# Patient Record
Sex: Female | Born: 1980 | Race: Black or African American | Hispanic: No | Marital: Single | Smoking: Former smoker
Health system: Southern US, Community
[De-identification: ages and names within clinical notes are randomized; demographics above are authoritative.]

## PROBLEM LIST (undated history)

## (undated) DIAGNOSIS — D649 Anemia, unspecified: Secondary | ICD-10-CM

## (undated) DIAGNOSIS — R87619 Unspecified abnormal cytological findings in specimens from cervix uteri: Secondary | ICD-10-CM

## (undated) DIAGNOSIS — B069 Rubella without complication: Secondary | ICD-10-CM

## (undated) DIAGNOSIS — I499 Cardiac arrhythmia, unspecified: Secondary | ICD-10-CM

## (undated) DIAGNOSIS — Z30017 Encounter for initial prescription of implantable subdermal contraceptive: Secondary | ICD-10-CM

## (undated) DIAGNOSIS — O24419 Gestational diabetes mellitus in pregnancy, unspecified control: Secondary | ICD-10-CM

## (undated) DIAGNOSIS — IMO0002 Reserved for concepts with insufficient information to code with codable children: Secondary | ICD-10-CM

## (undated) DIAGNOSIS — I10 Essential (primary) hypertension: Secondary | ICD-10-CM

## (undated) DIAGNOSIS — B059 Measles without complication: Secondary | ICD-10-CM

## (undated) DIAGNOSIS — F329 Major depressive disorder, single episode, unspecified: Secondary | ICD-10-CM

## (undated) DIAGNOSIS — Z5189 Encounter for other specified aftercare: Secondary | ICD-10-CM

## (undated) DIAGNOSIS — N938 Other specified abnormal uterine and vaginal bleeding: Secondary | ICD-10-CM

## (undated) DIAGNOSIS — S82892A Other fracture of left lower leg, initial encounter for closed fracture: Secondary | ICD-10-CM

## (undated) HISTORY — DX: Other fracture of left lower leg, initial encounter for closed fracture: S82.892A

## (undated) HISTORY — DX: Cardiac arrhythmia, unspecified: I49.9

## (undated) HISTORY — DX: Reserved for concepts with insufficient information to code with codable children: IMO0002

## (undated) HISTORY — DX: Anemia, unspecified: D64.9

## (undated) HISTORY — PX: CHOLECYSTECTOMY: SHX55

## (undated) HISTORY — PX: OTHER SURGICAL HISTORY: SHX169

## (undated) HISTORY — DX: Encounter for other specified aftercare: Z51.89

## (undated) HISTORY — DX: Major depressive disorder, single episode, unspecified: F32.9

## (undated) HISTORY — DX: Gestational diabetes mellitus in pregnancy, unspecified control: O24.419

## (undated) HISTORY — DX: Encounter for initial prescription of implantable subdermal contraceptive: Z30.017

## (undated) HISTORY — DX: Other specified abnormal uterine and vaginal bleeding: N93.8

## (undated) HISTORY — DX: Unspecified abnormal cytological findings in specimens from cervix uteri: R87.619

---

## 2005-05-08 ENCOUNTER — Ambulatory Visit (HOSPITAL_COMMUNITY): Admission: RE | Admit: 2005-05-08 | Discharge: 2005-05-08 | Payer: Self-pay | Admitting: Family Medicine

## 2005-05-08 ENCOUNTER — Ambulatory Visit: Payer: Self-pay | Admitting: Family Medicine

## 2005-05-10 ENCOUNTER — Other Ambulatory Visit: Admission: RE | Admit: 2005-05-10 | Discharge: 2005-05-10 | Payer: Self-pay | Admitting: Obstetrics & Gynecology

## 2005-05-15 ENCOUNTER — Ambulatory Visit: Payer: Self-pay | Admitting: Family Medicine

## 2005-10-18 ENCOUNTER — Ambulatory Visit: Payer: Self-pay | Admitting: Family Medicine

## 2005-12-30 ENCOUNTER — Encounter (INDEPENDENT_AMBULATORY_CARE_PROVIDER_SITE_OTHER): Payer: Self-pay | Admitting: Family Medicine

## 2006-01-19 ENCOUNTER — Ambulatory Visit: Payer: Self-pay | Admitting: Family Medicine

## 2006-01-22 ENCOUNTER — Encounter (INDEPENDENT_AMBULATORY_CARE_PROVIDER_SITE_OTHER): Payer: Self-pay | Admitting: Family Medicine

## 2006-02-02 ENCOUNTER — Other Ambulatory Visit: Admission: RE | Admit: 2006-02-02 | Discharge: 2006-02-02 | Payer: Self-pay | Admitting: Family Medicine

## 2006-02-02 ENCOUNTER — Ambulatory Visit: Payer: Self-pay | Admitting: Family Medicine

## 2006-02-02 ENCOUNTER — Ambulatory Visit (HOSPITAL_COMMUNITY): Admission: RE | Admit: 2006-02-02 | Discharge: 2006-02-02 | Payer: Self-pay | Admitting: Family Medicine

## 2006-02-06 ENCOUNTER — Ambulatory Visit (HOSPITAL_COMMUNITY): Admission: RE | Admit: 2006-02-06 | Discharge: 2006-02-06 | Payer: Self-pay | Admitting: Family Medicine

## 2006-03-02 ENCOUNTER — Ambulatory Visit: Payer: Self-pay | Admitting: Family Medicine

## 2006-04-18 ENCOUNTER — Ambulatory Visit (HOSPITAL_COMMUNITY): Admission: RE | Admit: 2006-04-18 | Discharge: 2006-04-18 | Payer: Self-pay | Admitting: Family Medicine

## 2006-04-18 ENCOUNTER — Ambulatory Visit: Payer: Self-pay | Admitting: Family Medicine

## 2006-04-23 ENCOUNTER — Ambulatory Visit: Payer: Self-pay | Admitting: Family Medicine

## 2006-05-01 ENCOUNTER — Ambulatory Visit: Payer: Self-pay | Admitting: Family Medicine

## 2006-05-17 ENCOUNTER — Ambulatory Visit (HOSPITAL_COMMUNITY): Admission: RE | Admit: 2006-05-17 | Discharge: 2006-05-17 | Payer: Self-pay | Admitting: Family Medicine

## 2006-05-17 ENCOUNTER — Encounter (INDEPENDENT_AMBULATORY_CARE_PROVIDER_SITE_OTHER): Payer: Self-pay | Admitting: Family Medicine

## 2006-05-17 LAB — CONVERTED CEMR LAB
RBC count: 5.64 10*6/uL
WBC, blood: 7.5 10*3/uL

## 2006-05-30 ENCOUNTER — Ambulatory Visit: Payer: Self-pay | Admitting: Family Medicine

## 2006-08-24 ENCOUNTER — Encounter (INDEPENDENT_AMBULATORY_CARE_PROVIDER_SITE_OTHER): Payer: Self-pay | Admitting: Family Medicine

## 2006-08-24 DIAGNOSIS — E669 Obesity, unspecified: Secondary | ICD-10-CM

## 2006-08-24 DIAGNOSIS — N83209 Unspecified ovarian cyst, unspecified side: Secondary | ICD-10-CM

## 2006-08-24 DIAGNOSIS — G43909 Migraine, unspecified, not intractable, without status migrainosus: Secondary | ICD-10-CM | POA: Insufficient documentation

## 2006-08-24 DIAGNOSIS — D500 Iron deficiency anemia secondary to blood loss (chronic): Secondary | ICD-10-CM

## 2006-08-24 DIAGNOSIS — E785 Hyperlipidemia, unspecified: Secondary | ICD-10-CM | POA: Insufficient documentation

## 2006-08-29 ENCOUNTER — Ambulatory Visit: Payer: Self-pay | Admitting: Family Medicine

## 2006-09-06 ENCOUNTER — Encounter (INDEPENDENT_AMBULATORY_CARE_PROVIDER_SITE_OTHER): Payer: Self-pay | Admitting: Family Medicine

## 2006-09-21 ENCOUNTER — Ambulatory Visit (HOSPITAL_COMMUNITY): Admission: RE | Admit: 2006-09-21 | Discharge: 2006-09-21 | Payer: Self-pay | Admitting: General Surgery

## 2006-09-21 ENCOUNTER — Encounter (INDEPENDENT_AMBULATORY_CARE_PROVIDER_SITE_OTHER): Payer: Self-pay | Admitting: *Deleted

## 2006-09-21 ENCOUNTER — Encounter (INDEPENDENT_AMBULATORY_CARE_PROVIDER_SITE_OTHER): Payer: Self-pay | Admitting: Family Medicine

## 2006-10-23 ENCOUNTER — Encounter (INDEPENDENT_AMBULATORY_CARE_PROVIDER_SITE_OTHER): Payer: Self-pay | Admitting: Family Medicine

## 2006-11-23 ENCOUNTER — Ambulatory Visit: Payer: Self-pay | Admitting: Family Medicine

## 2006-11-26 ENCOUNTER — Ambulatory Visit (HOSPITAL_COMMUNITY): Admission: RE | Admit: 2006-11-26 | Discharge: 2006-11-26 | Payer: Self-pay | Admitting: Family Medicine

## 2006-11-26 ENCOUNTER — Encounter (INDEPENDENT_AMBULATORY_CARE_PROVIDER_SITE_OTHER): Payer: Self-pay | Admitting: Family Medicine

## 2006-11-27 ENCOUNTER — Encounter (INDEPENDENT_AMBULATORY_CARE_PROVIDER_SITE_OTHER): Payer: Self-pay | Admitting: Family Medicine

## 2006-11-29 ENCOUNTER — Ambulatory Visit: Payer: Self-pay | Admitting: Family Medicine

## 2006-12-05 ENCOUNTER — Encounter (INDEPENDENT_AMBULATORY_CARE_PROVIDER_SITE_OTHER): Payer: Self-pay | Admitting: Family Medicine

## 2006-12-05 ENCOUNTER — Ambulatory Visit (HOSPITAL_COMMUNITY): Admission: RE | Admit: 2006-12-05 | Discharge: 2006-12-05 | Payer: Self-pay | Admitting: Family Medicine

## 2006-12-27 ENCOUNTER — Ambulatory Visit: Payer: Self-pay | Admitting: Family Medicine

## 2007-02-11 ENCOUNTER — Ambulatory Visit (HOSPITAL_COMMUNITY): Admission: RE | Admit: 2007-02-11 | Discharge: 2007-02-11 | Payer: Self-pay | Admitting: Family Medicine

## 2007-02-11 ENCOUNTER — Ambulatory Visit: Payer: Self-pay | Admitting: Family Medicine

## 2007-02-27 ENCOUNTER — Encounter (HOSPITAL_COMMUNITY): Admission: RE | Admit: 2007-02-27 | Discharge: 2007-03-29 | Payer: Self-pay | Admitting: Family Medicine

## 2007-02-28 ENCOUNTER — Encounter (INDEPENDENT_AMBULATORY_CARE_PROVIDER_SITE_OTHER): Payer: Self-pay | Admitting: Family Medicine

## 2007-03-04 ENCOUNTER — Encounter (INDEPENDENT_AMBULATORY_CARE_PROVIDER_SITE_OTHER): Payer: Self-pay | Admitting: Family Medicine

## 2007-03-14 ENCOUNTER — Encounter (INDEPENDENT_AMBULATORY_CARE_PROVIDER_SITE_OTHER): Payer: Self-pay | Admitting: Family Medicine

## 2007-04-22 ENCOUNTER — Ambulatory Visit: Payer: Self-pay | Admitting: Family Medicine

## 2007-04-22 DIAGNOSIS — F341 Dysthymic disorder: Secondary | ICD-10-CM

## 2007-04-22 DIAGNOSIS — I10 Essential (primary) hypertension: Secondary | ICD-10-CM | POA: Insufficient documentation

## 2007-04-22 LAB — CONVERTED CEMR LAB: Cholesterol, target level: 200 mg/dL

## 2007-04-29 ENCOUNTER — Encounter (INDEPENDENT_AMBULATORY_CARE_PROVIDER_SITE_OTHER): Payer: Self-pay | Admitting: Family Medicine

## 2007-05-01 ENCOUNTER — Telehealth (INDEPENDENT_AMBULATORY_CARE_PROVIDER_SITE_OTHER): Payer: Self-pay | Admitting: *Deleted

## 2007-05-01 LAB — CONVERTED CEMR LAB
AST: 14 units/L (ref 0–37)
Alkaline Phosphatase: 69 units/L (ref 39–117)
BUN: 9 mg/dL (ref 6–23)
Basophils Absolute: 0 10*3/uL (ref 0.0–0.1)
Eosinophils Absolute: 0.3 10*3/uL (ref 0.0–0.7)
Glucose, Bld: 89 mg/dL (ref 70–99)
HCV Ab: NEGATIVE
HDL: 35 mg/dL — ABNORMAL LOW (ref >39)
Hep A IgM: NEGATIVE
Hep B C IgM: NEGATIVE
Hepatitis B Surface Ag: NEGATIVE
Lymphs Abs: 2.3 10*3/uL (ref 0.7–3.3)
MCHC: 29.7 g/dL — ABNORMAL LOW (ref 30.0–36.0)
Monocytes Absolute: 0.3 10*3/uL (ref 0.2–0.7)
Monocytes Relative: 4 % (ref 3–11)
Potassium: 4.1 meq/L (ref 3.5–5.3)
Sodium: 139 meq/L (ref 135–145)
Total Bilirubin: 0.5 mg/dL (ref 0.3–1.2)
Total Protein: 7.5 g/dL (ref 6.0–8.3)
VLDL: 15 mg/dL (ref 0–40)
WBC: 6.8 10*3/uL (ref 4.0–10.5)

## 2007-06-10 ENCOUNTER — Encounter (INDEPENDENT_AMBULATORY_CARE_PROVIDER_SITE_OTHER): Payer: Self-pay | Admitting: Family Medicine

## 2007-07-09 ENCOUNTER — Other Ambulatory Visit: Admission: RE | Admit: 2007-07-09 | Discharge: 2007-07-09 | Payer: Self-pay | Admitting: Obstetrics and Gynecology

## 2007-07-23 ENCOUNTER — Encounter (INDEPENDENT_AMBULATORY_CARE_PROVIDER_SITE_OTHER): Payer: Self-pay | Admitting: Family Medicine

## 2007-09-30 ENCOUNTER — Ambulatory Visit: Payer: Self-pay | Admitting: Family Medicine

## 2007-09-30 ENCOUNTER — Telehealth (INDEPENDENT_AMBULATORY_CARE_PROVIDER_SITE_OTHER): Payer: Self-pay | Admitting: Family Medicine

## 2007-10-17 IMAGING — CR DG KNEE 3 VIEWS*L*
3 series · 3 of 3 positions shown · non-contrast
Comparison: none

CLINICAL DATA: Left leg pain after an injury eight months ago.  Increasing pain.
 LEFT KNEE ? 3 VIEW ? 02/11/07:

[view not recorded (1 of 3)]
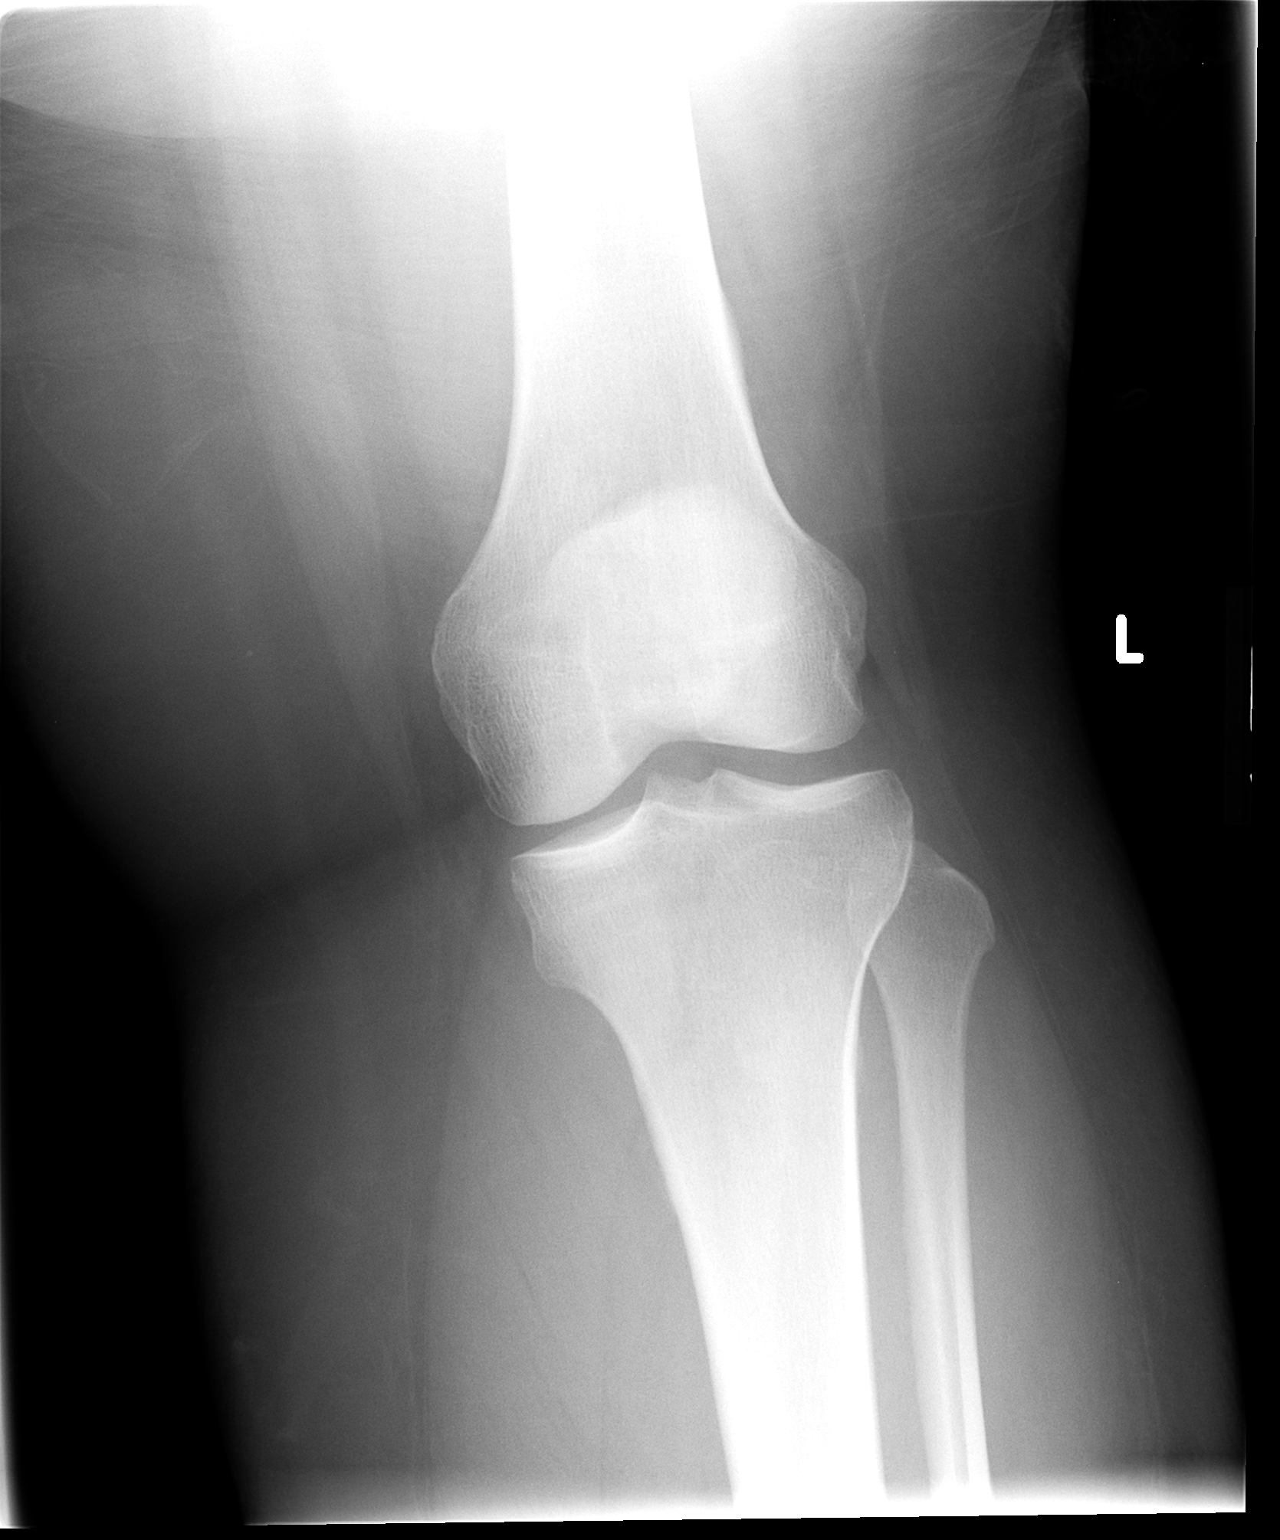

[view not recorded (2 of 3)]
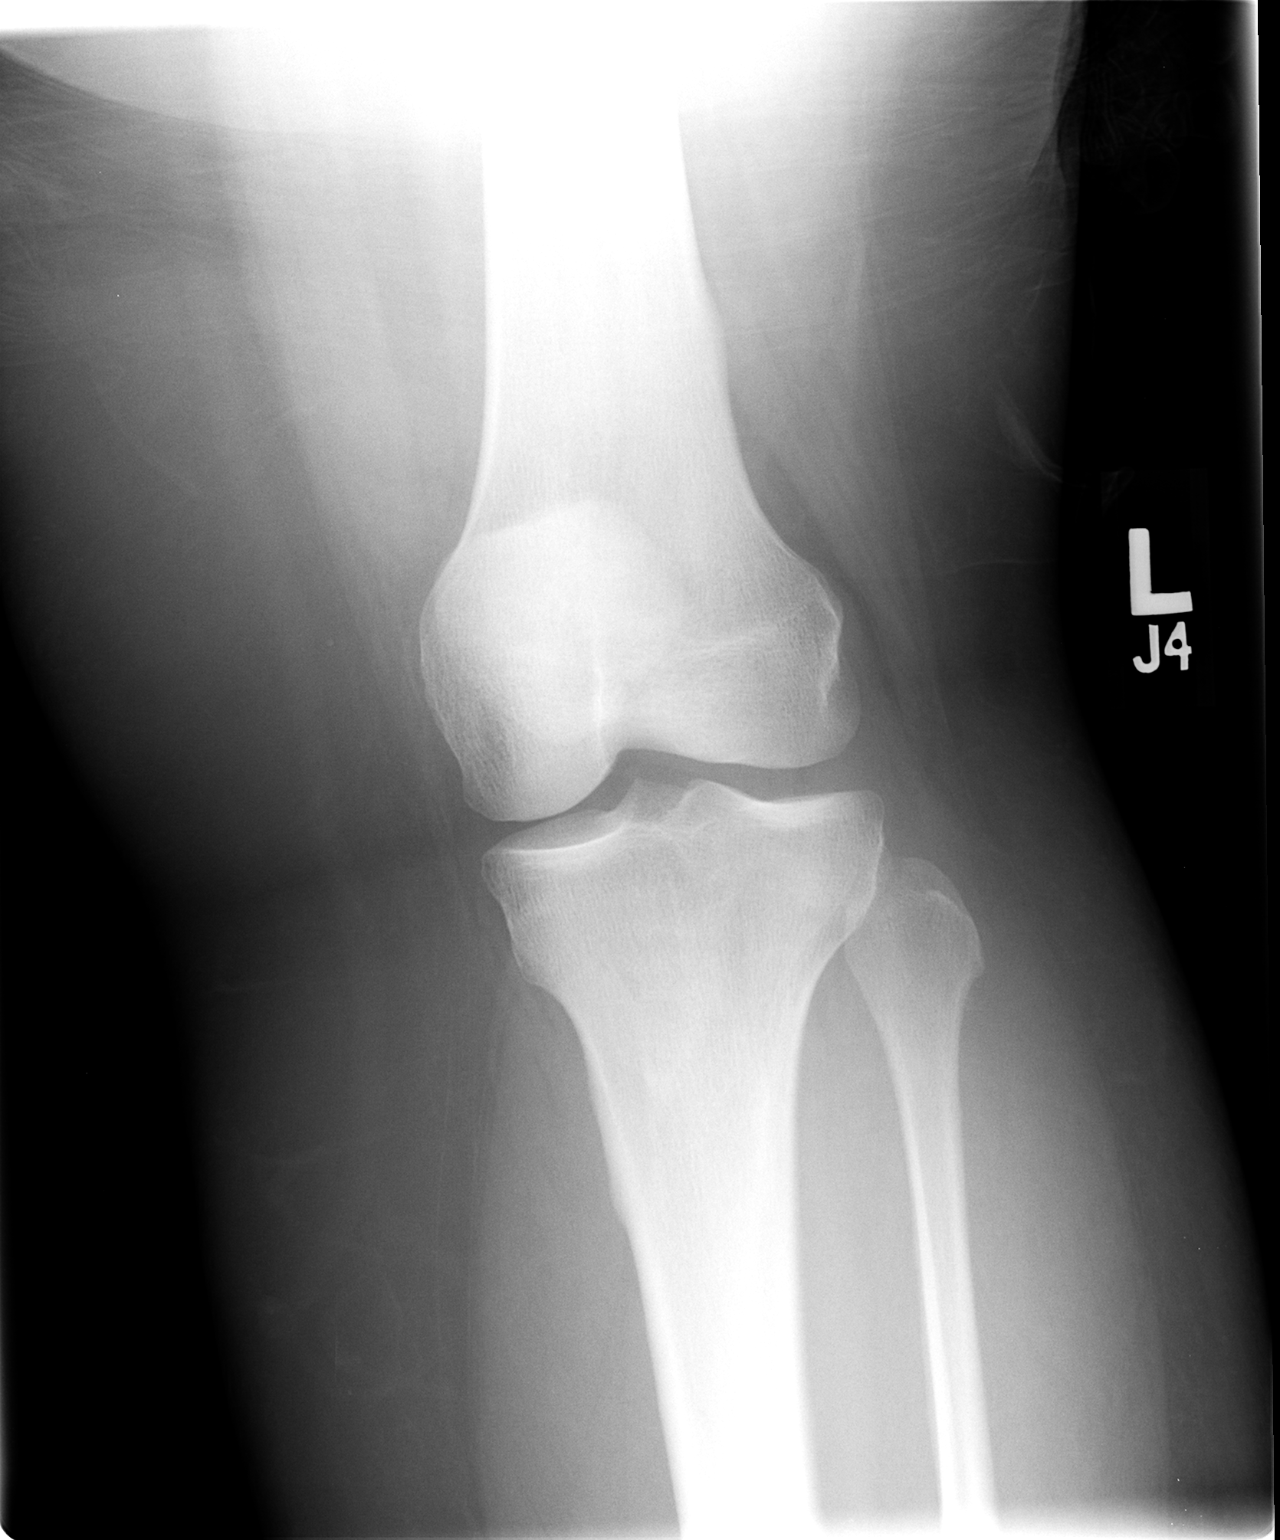

[view not recorded (3 of 3)]
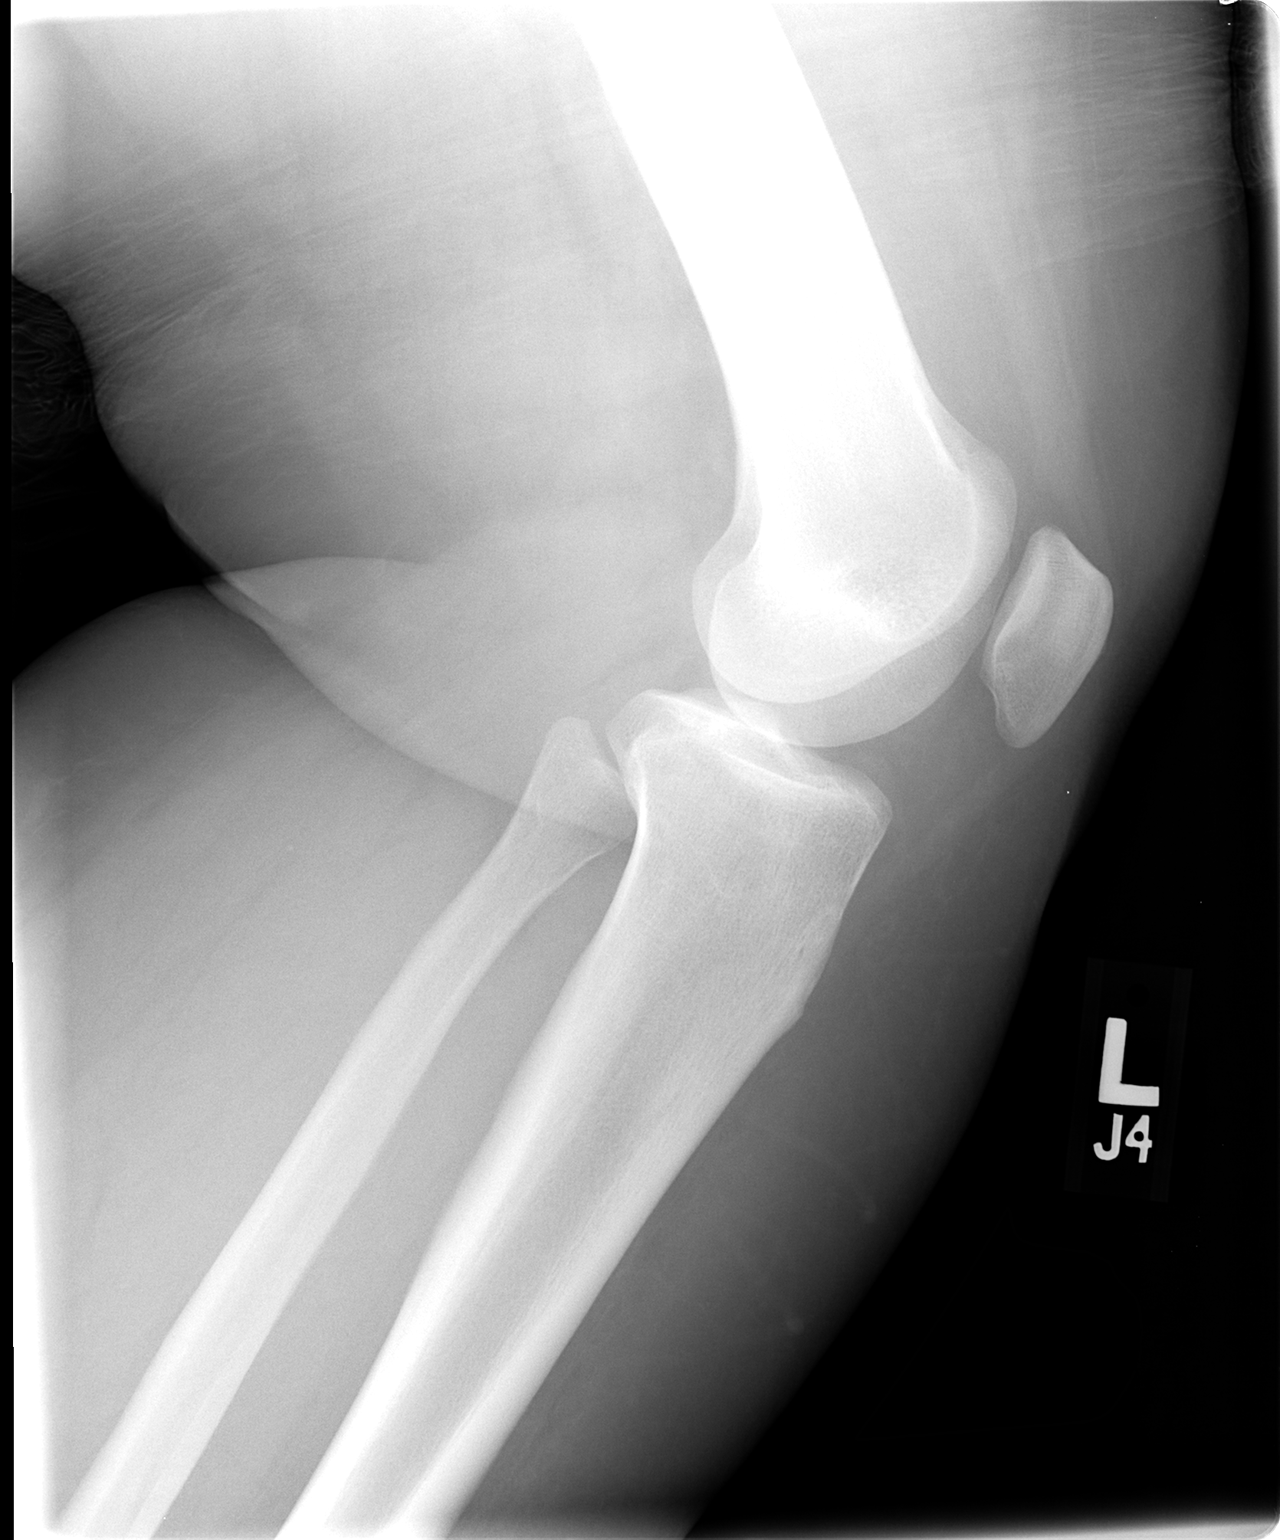

[3 of 3 positions shown; findings below may reference images not displayed]

FINDINGS: No joint effusion or fracture.  No significant degenerative changes.
IMPRESSION: No acute findings.

## 2007-10-24 ENCOUNTER — Encounter: Payer: Self-pay | Admitting: Family Medicine

## 2007-10-25 ENCOUNTER — Ambulatory Visit: Payer: Self-pay | Admitting: Family Medicine

## 2007-10-26 LAB — CONVERTED CEMR LAB
ALT: 16 units/L (ref 0–35)
AST: 17 units/L (ref 0–37)
Albumin: 4.2 g/dL (ref 3.5–5.2)
Chloride: 107 meq/L (ref 96–112)
Cholesterol: 189 mg/dL (ref 0–200)
Glucose, Bld: 75 mg/dL (ref 70–99)
HCT: 42.9 % (ref 36.0–46.0)
HDL: 34 mg/dL — ABNORMAL LOW (ref >39)
Hemoglobin: 13.4 g/dL (ref 12.0–15.0)
LDL Cholesterol: 131 mg/dL — ABNORMAL HIGH (ref 0–99)
MCV: 70.6 fL — ABNORMAL LOW (ref 78.0–100.0)
Platelets: 334 10*3/uL (ref 150–400)
Potassium: 4.1 meq/L (ref 3.5–5.3)
RBC: 6.08 M/uL — ABNORMAL HIGH (ref 3.87–5.11)
Sodium: 139 meq/L (ref 135–145)
Total Bilirubin: 0.4 mg/dL (ref 0.3–1.2)
Total CHOL/HDL Ratio: 5.6
VLDL: 24 mg/dL (ref 0–40)
WBC: 6.4 10*3/uL (ref 4.0–10.5)

## 2007-10-28 ENCOUNTER — Telehealth (INDEPENDENT_AMBULATORY_CARE_PROVIDER_SITE_OTHER): Payer: Self-pay | Admitting: *Deleted

## 2008-01-09 ENCOUNTER — Telehealth (INDEPENDENT_AMBULATORY_CARE_PROVIDER_SITE_OTHER): Payer: Self-pay | Admitting: Family Medicine

## 2008-01-10 ENCOUNTER — Ambulatory Visit: Payer: Self-pay | Admitting: Family Medicine

## 2008-01-11 ENCOUNTER — Encounter (INDEPENDENT_AMBULATORY_CARE_PROVIDER_SITE_OTHER): Payer: Self-pay | Admitting: Family Medicine

## 2008-01-13 ENCOUNTER — Telehealth (INDEPENDENT_AMBULATORY_CARE_PROVIDER_SITE_OTHER): Payer: Self-pay | Admitting: *Deleted

## 2008-01-13 LAB — CONVERTED CEMR LAB
Iron: 25 ug/dL — ABNORMAL LOW (ref 42–145)
Retic Ct Pct: 0.8 % (ref 0.4–3.1)
Saturation Ratios: 8 % — ABNORMAL LOW (ref 20–55)
TIBC: 304 ug/dL (ref 250–470)

## 2008-01-14 ENCOUNTER — Encounter (INDEPENDENT_AMBULATORY_CARE_PROVIDER_SITE_OTHER): Payer: Self-pay | Admitting: *Deleted

## 2008-01-14 ENCOUNTER — Encounter (INDEPENDENT_AMBULATORY_CARE_PROVIDER_SITE_OTHER): Payer: Self-pay | Admitting: Family Medicine

## 2008-01-29 ENCOUNTER — Encounter (INDEPENDENT_AMBULATORY_CARE_PROVIDER_SITE_OTHER): Payer: Self-pay | Admitting: Family Medicine

## 2008-01-29 ENCOUNTER — Other Ambulatory Visit: Admission: RE | Admit: 2008-01-29 | Discharge: 2008-01-29 | Payer: Self-pay | Admitting: Family Medicine

## 2008-01-29 ENCOUNTER — Ambulatory Visit: Payer: Self-pay | Admitting: Family Medicine

## 2008-02-04 ENCOUNTER — Telehealth (INDEPENDENT_AMBULATORY_CARE_PROVIDER_SITE_OTHER): Payer: Self-pay | Admitting: *Deleted

## 2008-02-05 ENCOUNTER — Encounter (INDEPENDENT_AMBULATORY_CARE_PROVIDER_SITE_OTHER): Payer: Self-pay | Admitting: Family Medicine

## 2008-04-29 ENCOUNTER — Encounter (INDEPENDENT_AMBULATORY_CARE_PROVIDER_SITE_OTHER): Payer: Self-pay | Admitting: Family Medicine

## 2008-12-01 ENCOUNTER — Ambulatory Visit: Payer: Self-pay | Admitting: Family Medicine

## 2009-01-18 ENCOUNTER — Encounter (INDEPENDENT_AMBULATORY_CARE_PROVIDER_SITE_OTHER): Payer: Self-pay | Admitting: Family Medicine

## 2009-01-19 ENCOUNTER — Encounter (INDEPENDENT_AMBULATORY_CARE_PROVIDER_SITE_OTHER): Payer: Self-pay | Admitting: Family Medicine

## 2009-01-19 LAB — CONVERTED CEMR LAB
ALT: 16 units/L (ref 0–35)
AST: 23 units/L (ref 0–37)
Albumin: 4.2 g/dL (ref 3.5–5.2)
Basophils Absolute: 0 10*3/uL (ref 0.0–0.1)
Basophils Relative: 0 % (ref 0–1)
CO2: 25 meq/L (ref 19–32)
Chloride: 100 meq/L (ref 96–112)
Cholesterol: 188 mg/dL (ref 0–200)
Creatinine, Ser: 0.77 mg/dL (ref 0.40–1.20)
Eosinophils Relative: 2 % (ref 0–5)
HCT: 44.5 % (ref 36.0–46.0)
Hemoglobin: 13.5 g/dL (ref 12.0–15.0)
MCHC: 30.3 g/dL (ref 30.0–36.0)
Monocytes Absolute: 0.3 10*3/uL (ref 0.1–1.0)
Neutrophils Relative %: 67 % (ref 43–77)
Platelets: 331 10*3/uL (ref 150–400)
RBC: 5.94 M/uL — ABNORMAL HIGH (ref 3.87–5.11)
Total CHOL/HDL Ratio: 5.4
Total Protein: 8.4 g/dL — ABNORMAL HIGH (ref 6.0–8.3)
Triglycerides: 65 mg/dL (ref <150)
VLDL: 13 mg/dL (ref 0–40)

## 2009-01-21 LAB — CONVERTED CEMR LAB
Iron: 36 ug/dL — ABNORMAL LOW (ref 42–145)
Saturation Ratios: 12 % — ABNORMAL LOW (ref 20–55)
TIBC: 294 ug/dL (ref 250–470)

## 2009-01-22 ENCOUNTER — Ambulatory Visit: Payer: Self-pay | Admitting: Family Medicine

## 2009-01-22 ENCOUNTER — Encounter (INDEPENDENT_AMBULATORY_CARE_PROVIDER_SITE_OTHER): Payer: Self-pay | Admitting: *Deleted

## 2009-01-22 DIAGNOSIS — R21 Rash and other nonspecific skin eruption: Secondary | ICD-10-CM | POA: Insufficient documentation

## 2009-01-22 DIAGNOSIS — O99810 Abnormal glucose complicating pregnancy: Secondary | ICD-10-CM

## 2009-06-04 ENCOUNTER — Encounter (INDEPENDENT_AMBULATORY_CARE_PROVIDER_SITE_OTHER): Payer: Self-pay | Admitting: Family Medicine

## 2009-07-06 ENCOUNTER — Encounter (INDEPENDENT_AMBULATORY_CARE_PROVIDER_SITE_OTHER): Payer: Self-pay | Admitting: Family Medicine

## 2009-07-13 ENCOUNTER — Encounter (INDEPENDENT_AMBULATORY_CARE_PROVIDER_SITE_OTHER): Payer: Self-pay | Admitting: Family Medicine

## 2010-11-09 ENCOUNTER — Other Ambulatory Visit
Admission: RE | Admit: 2010-11-09 | Discharge: 2010-11-09 | Payer: Self-pay | Source: Home / Self Care | Admitting: Obstetrics & Gynecology

## 2010-11-09 ENCOUNTER — Other Ambulatory Visit: Payer: Self-pay | Admitting: Obstetrics & Gynecology

## 2010-11-13 ENCOUNTER — Encounter: Payer: Self-pay | Admitting: Family Medicine

## 2010-11-20 LAB — CONVERTED CEMR LAB: Pap Smear: ABNORMAL

## 2010-11-22 NOTE — Letter (Signed)
Summary: rpc chart  rpc chart   Imported By: Curtis Sites 08/04/2010 15:11:00  _____________________________________________________________________  External Attachment:    Type:   Image     Comment:   External Document

## 2011-03-10 NOTE — H&P (Signed)
NAME:  Heather Moyer, HOCH NO.:  [id number]   MEDICAL RECORD NO.:  [id number]          PATIENT TYPE:  AMB   LOCATION:  DAY                           FACILITY:  [facility]   PHYSICIAN:  Dalia Heading, M.D.  DATE OF BIRTH:  Nov 04, 1980   DATE OF ADMISSION:  09/21/2006  DATE OF DISCHARGE:  LH                                HISTORY & PHYSICAL   CHIEF COMPLAINT:  Sebaceous cyst, left breast.   HISTORY OF PRESENT ILLNESS:  The patient is a 30 year old black female who  is referred for evaluation and treatment of a cyst along the inner left  breast.  It has been intermittently draining over the past few months.  Antibiotics have not been helpful.   PAST MEDICAL HISTORY:  Anemia.   PAST SURGICAL HISTORY:  C-section, cholecystectomy, wisdom teeth extraction.   CURRENT MEDICATIONS:  Iron supplements.   ALLERGIES:  No known drug allergies.   REVIEW OF SYSTEMS:  Noncontributory.   PHYSICAL EXAMINATION:  GENERAL:  The patient is an obese black female in no acute distress.  LUNGS:  Clear to auscultation with equal breath sounds bilaterally.  HEART:  Regular rate and rhythm without S3, S4, or murmurs.  BREASTS:  Left breast examination reveals a small draining cystic lesion  noted along the inner mid aspect of the left breast along the chest wall.  No dominant mass, nipple discharge, or dimpling are noted.   IMPRESSION:  Sebaceous cyst, left breast.   PLAN:  The patient is scheduled for excision of the cyst, left breast on  September 21, 2006.  The risks and benefits of the procedure including  bleeding, infection, and recurrence of the cyst were fully explained to the  patient who gave informed consent.      Dalia Heading, M.D.  Electronically Signed    MAJ/MEDQ  D:  09/18/2006  T:  09/18/2006  Job:  [id number]

## 2011-03-10 NOTE — Op Note (Signed)
Heather Moyer, Heather Moyer                ACCOUNT NO.:  [id number]   MEDICAL RECORD NO.:  [id number]          PATIENT TYPE:  AMB   LOCATION:  DAY                           FACILITY:  [facility]   PHYSICIAN:  Dalia Heading, M.D.  DATE OF BIRTH:  [birth year]   DATE OF PROCEDURE:  09/21/2006  DATE OF DISCHARGE:                               OPERATIVE REPORT   PREOPERATIVE DIAGNOSIS:  Sebaceous cyst, left breast.   POSTOPERATIVE DIAGNOSIS:  Sebaceous cyst, left breast.   PROCEDURE:  Excision of sebaceous cyst, left breast.   SURGEON:  Dalia Heading, M.D.   ANESTHESIA:  General   INDICATIONS:  The patient is a 30 year old black female who is referred  for evaluation and treatment of a cyst along the left inner breast.  This has been draining intermittently over the last few months and  antibiotics have not been 7-Up helpful.  The patient now comes to the  operating room for excision of the recurrent cyst of the left breast.  The risks and benefits of the procedure were fully explained to the  patient, who gave informed consent.   PROCEDURE NOTE:  The patient was placed in the supine position.  After  general anesthesia was administered, the inner left breast was prepped  and draped using the usual sterile technique with Betadine.  Surgical  site confirmation was performed.   An elliptical incision was made along the lower, medial aspect of the  left breast.  Granulation tissue and a small amount of purulent fluid  was noted at the base.  In this area was curetted and any tissue was  debrided without difficulty.  This tissue was removed and sent to  pathology for further examination.  Any bleeding was controlled using  Bovie electrocautery.  The surrounding skin was injected with 0.5 cm  Sensorcaine.  The skin was closed using 4-0 nylon interrupted sutures.  Betadine ointment, and dry sterile dressing were applied.   All tape and needle counts were correct at the end of the procedure.  The  patient was awakened and transferred to PACU in stable condition.   COMPLICATIONS:  None.   SPECIMEN:  Sebaceous cyst, left breast.   BLOOD LOSS:  Minimal.      Dalia Heading, M.D.  Electronically Signed     MAJ/MEDQ  D:  09/21/2006  T:  09/21/2006  Job:  [id number]   cc:   Dr. Johnnye Lana

## 2012-06-27 ENCOUNTER — Emergency Department (HOSPITAL_COMMUNITY)
Admission: EM | Admit: 2012-06-27 | Discharge: 2012-06-28 | Disposition: A | Discharge status: Home / Self Care | Payer: BC Managed Care – PPO | Attending: Emergency Medicine | Admitting: Emergency Medicine

## 2012-06-27 ENCOUNTER — Encounter (HOSPITAL_COMMUNITY): Payer: Self-pay | Admitting: *Deleted

## 2012-06-27 DIAGNOSIS — O200 Threatened abortion: Secondary | ICD-10-CM

## 2012-06-27 DIAGNOSIS — N898 Other specified noninflammatory disorders of vagina: Secondary | ICD-10-CM | POA: Insufficient documentation

## 2012-06-27 DIAGNOSIS — R109 Unspecified abdominal pain: Secondary | ICD-10-CM | POA: Insufficient documentation

## 2012-06-27 DIAGNOSIS — I10 Essential (primary) hypertension: Secondary | ICD-10-CM | POA: Insufficient documentation

## 2012-06-27 HISTORY — DX: Essential (primary) hypertension: I10

## 2012-06-27 LAB — URINE MICROSCOPIC-ADD ON

## 2012-06-27 LAB — URINALYSIS, ROUTINE W REFLEX MICROSCOPIC
Bilirubin Urine: NEGATIVE
Glucose, UA: NEGATIVE mg/dL
Ketones, ur: NEGATIVE mg/dL
Leukocytes, UA: NEGATIVE
Nitrite: NEGATIVE
Specific Gravity, Urine: 1.025 (ref 1.005–1.030)
pH: 6.5 (ref 5.0–8.0)

## 2012-06-27 LAB — COMPREHENSIVE METABOLIC PANEL
ALT: 11 U/L (ref 0–35)
Albumin: 2.8 g/dL — ABNORMAL LOW (ref 3.5–5.2)
Alkaline Phosphatase: 55 U/L (ref 39–117)
Calcium: 8.8 mg/dL (ref 8.4–10.5)
GFR calc Af Amer: >90 mL/min (ref >90)
Potassium: 3.1 mEq/L — ABNORMAL LOW (ref 3.5–5.1)
Sodium: 133 mEq/L — ABNORMAL LOW (ref 135–145)
Total Protein: 6.9 g/dL (ref 6.0–8.3)

## 2012-06-27 LAB — CBC WITH DIFFERENTIAL/PLATELET
Basophils Absolute: 0 10*3/uL (ref 0.0–0.1)
Basophils Relative: 0 % (ref 0–1)
Eosinophils Absolute: 0.3 10*3/uL (ref 0.0–0.7)
Eosinophils Relative: 4 % (ref 0–5)
MCH: 23.5 pg — ABNORMAL LOW (ref 26.0–34.0)
MCHC: 31.8 g/dL (ref 30.0–36.0)
MCV: 73.7 fL — ABNORMAL LOW (ref 78.0–100.0)
Neutrophils Relative %: 60 % (ref 43–77)
Platelets: 296 10*3/uL (ref 150–400)
RBC: 4.56 MIL/uL (ref 3.87–5.11)
RDW: 16.8 % — ABNORMAL HIGH (ref 11.5–15.5)

## 2012-06-27 NOTE — ED Provider Notes (Signed)
History  This chart was scribed for Heather Lennert, MD by Heather Moyer. This patient was seen in room APA09/APA09 and the patient's care was started at 1816.   CSN: [id number]  Arrival date & time 06/27/12  1816   First MD Initiated Contact with Patient 06/27/12 2123      Chief Complaint  Patient presents with  . Abdominal Pain   Patient is a 31 y.o. female presenting with abdominal pain. The history is provided by the patient. No language interpreter was used.  Abdominal Pain The primary symptoms of the illness include abdominal pain (Lower abd pain) and vaginal bleeding. The primary symptoms of the illness do not include fatigue, shortness of breath or diarrhea. The current episode started yesterday. The onset of the illness was gradual. The problem has been gradually worsening.  Symptoms associated with the illness do not include chills, urgency, hematuria, frequency or back pain.   Heather Moyer is a 31 y.o. female who presents to the Emergency Department complaining of constant lower abdominal pain since yesterday. She thought she was going to start her menstrual period but ha not yet and she states passed a large clot this AM. She also had one episode of heavy vaginal bleeding yesterday. She states currently having lower abdominal pain and denies any urinary symptoms or dysuria and no fever. She has no OBGYN  Past Medical History  Diagnosis Date  . Hypertension     Past Surgical History  Procedure Date  . Cholecystectomy   . Cesarean section   . Cyst removed     History reviewed. No pertinent family history.  History  Substance Use Topics  . Smoking status: Former Games developer  . Smokeless tobacco: Not on file  . Alcohol Use: Yes     socially     OB History    Grav Para Term Preterm Abortions TAB SAB Ect Mult Living                  Review of Systems  Constitutional: Negative for chills and fatigue.  HENT: Negative for congestion, sinus pressure and ear discharge.     Eyes: Negative for discharge.  Respiratory: Negative for cough and shortness of breath.   Cardiovascular: Negative for chest pain.  Gastrointestinal: Positive for abdominal pain (Lower abd pain). Negative for diarrhea.  Genitourinary: Positive for vaginal bleeding. Negative for urgency, frequency and hematuria.  Musculoskeletal: Negative for back pain.  Skin: Negative for rash.  Neurological: Negative for seizures and headaches.  Hematological: Negative.   Psychiatric/Behavioral: Negative for hallucinations.  All other systems reviewed and are negative.    Allergies  Penicillins  Home Medications   Current Outpatient Rx  Name Route Sig Dispense Refill  . NAPROXEN SODIUM 220 MG PO CAPS Oral Take 1 capsule by mouth once as needed. For pain      Triage Vitals: BP 140/78  Pulse 76  Temp 98.1 F (36.7 C) (Oral)  Resp 20  Ht 5\' 4"  (1.626 m)  Wt 286 lb (129.729 kg)  BMI 49.09 kg/m2  SpO2 100%  Physical Exam  Nursing note and vitals reviewed. Constitutional: She is oriented to person, place, and time. She appears well-developed.  HENT:  Head: Normocephalic.  Eyes: Conjunctivae are normal.  Neck: No tracheal deviation present.  Cardiovascular: Normal rate.   No murmur heard. Abdominal: Soft. Bowel sounds are normal. She exhibits no distension. There is tenderness (Suprapubic tenderness.). There is no rebound and no guarding.  Musculoskeletal: Normal range of motion.  Neurological: She is oriented to person, place, and time.  Skin: Skin is warm.  Psychiatric: She has a normal mood and affect.   Pelvic exam os closed moderate blood and tender adenexal bilaterally ED Course  Procedures (including critical care time) DIAGNOSTIC STUDIES: Oxygen Saturation is 100% on room air, normal by my interpretation.    COORDINATION OF CARE: At 925  Discussed treatment plan with patient which includes blood work, UA. Patient agrees.    Labs Reviewed  CBC WITH DIFFERENTIAL   COMPREHENSIVE METABOLIC PANEL  URINALYSIS, ROUTINE W REFLEX MICROSCOPIC  PREGNANCY, URINE   No results found.   No diagnosis found.    MDM  The chart was scribed for me under my direct supervision.  I personally performed the history, physical, and medical decision making and all procedures in the evaluation of this patient.Heather Lennert, MD 06/28/12 [phone]

## 2012-06-27 NOTE — ED Notes (Signed)
Pt with .lower abd pain since yesterday, states yesterday passed large blood clot from vagina yesterday, denies any N/V/D

## 2012-06-28 ENCOUNTER — Emergency Department (HOSPITAL_COMMUNITY): Payer: BC Managed Care – PPO

## 2012-06-28 LAB — HCG, QUANTITATIVE, PREGNANCY: hCG, Beta Chain, Quant, S: 34236 m[IU]/mL — ABNORMAL HIGH (ref <5)

## 2012-06-28 LAB — TYPE AND SCREEN

## 2012-06-28 MED ORDER — SM PRENATAL VITAMINS 28-0.8 MG PO TABS: 1.0000 | ORAL_TABLET | Freq: Every day | ORAL | Status: DC

## 2012-07-17 ENCOUNTER — Other Ambulatory Visit (HOSPITAL_COMMUNITY)
Admission: RE | Admit: 2012-07-17 | Discharge: 2012-07-17 | Disposition: A | Discharge status: Home / Self Care | Payer: BC Managed Care – PPO | Source: Ambulatory Visit | Attending: Obstetrics and Gynecology | Admitting: Obstetrics and Gynecology

## 2012-07-17 ENCOUNTER — Other Ambulatory Visit: Payer: Self-pay | Admitting: Family Medicine

## 2012-07-17 DIAGNOSIS — Z01419 Encounter for gynecological examination (general) (routine) without abnormal findings: Secondary | ICD-10-CM | POA: Insufficient documentation

## 2012-07-17 DIAGNOSIS — Z1151 Encounter for screening for human papillomavirus (HPV): Secondary | ICD-10-CM | POA: Insufficient documentation

## 2012-07-17 DIAGNOSIS — R87810 Cervical high risk human papillomavirus (HPV) DNA test positive: Secondary | ICD-10-CM | POA: Insufficient documentation

## 2012-08-15 LAB — OB RESULTS CONSOLE HEPATITIS B SURFACE ANTIGEN: Hepatitis B Surface Ag: NEGATIVE

## 2012-08-15 LAB — OB RESULTS CONSOLE RPR: RPR: NONREACTIVE

## 2012-11-06 ENCOUNTER — Ambulatory Visit: Payer: BC Managed Care – PPO | Admitting: Dietician

## 2012-11-07 ENCOUNTER — Telehealth (HOSPITAL_COMMUNITY): Payer: Self-pay | Admitting: Dietician

## 2012-11-07 NOTE — Telephone Encounter (Signed)
Received referral via fax from [facility] Cyril Mourning) for dx: gestational diabetes. Per referral, pt is unable to travel to [place]. Chart reviewed and noted pt was scheduled for GDM class on 11/06/12. It appears pt was a no-show for that appointment.

## 2012-11-07 NOTE — Telephone Encounter (Signed)
Called and left message on pt voicemail at [phone].

## 2012-11-15 NOTE — Telephone Encounter (Signed)
Pt did not respond to first contact attempt. Sent letter to pt home via US Mail in attempt to contact pt to schedule appointment.  

## 2012-11-21 NOTE — Telephone Encounter (Signed)
Pt has not responded to previous contact attempts. Final attempt. Sent letter to pt home via US Mail in attempt to contact pt to schedule appointment.  

## 2012-11-28 NOTE — Telephone Encounter (Signed)
Pt has not responded to prior attempts to contact to schedule appointment. Referral filed.  

## 2013-01-09 ENCOUNTER — Other Ambulatory Visit: Payer: Self-pay | Admitting: Obstetrics & Gynecology

## 2013-01-09 DIAGNOSIS — O34219 Maternal care for unspecified type scar from previous cesarean delivery: Secondary | ICD-10-CM

## 2013-01-09 DIAGNOSIS — O24913 Unspecified diabetes mellitus in pregnancy, third trimester: Secondary | ICD-10-CM

## 2013-01-10 ENCOUNTER — Ambulatory Visit (INDEPENDENT_AMBULATORY_CARE_PROVIDER_SITE_OTHER): Payer: Medicaid Other

## 2013-01-10 ENCOUNTER — Encounter: Payer: Self-pay | Admitting: Obstetrics and Gynecology

## 2013-01-10 ENCOUNTER — Ambulatory Visit (INDEPENDENT_AMBULATORY_CARE_PROVIDER_SITE_OTHER): Payer: Medicaid Other | Admitting: Obstetrics and Gynecology

## 2013-01-10 VITALS — BP 140/80 | Wt 328.2 lb

## 2013-01-10 DIAGNOSIS — O34219 Maternal care for unspecified type scar from previous cesarean delivery: Secondary | ICD-10-CM

## 2013-01-10 DIAGNOSIS — Z98891 History of uterine scar from previous surgery: Secondary | ICD-10-CM

## 2013-01-10 DIAGNOSIS — O0993 Supervision of high risk pregnancy, unspecified, third trimester: Secondary | ICD-10-CM

## 2013-01-10 DIAGNOSIS — O99810 Abnormal glucose complicating pregnancy: Secondary | ICD-10-CM

## 2013-01-10 DIAGNOSIS — O09899 Supervision of other high risk pregnancies, unspecified trimester: Secondary | ICD-10-CM

## 2013-01-10 DIAGNOSIS — Z1389 Encounter for screening for other disorder: Secondary | ICD-10-CM

## 2013-01-10 DIAGNOSIS — O10019 Pre-existing essential hypertension complicating pregnancy, unspecified trimester: Secondary | ICD-10-CM

## 2013-01-10 DIAGNOSIS — Z331 Pregnant state, incidental: Secondary | ICD-10-CM

## 2013-01-10 DIAGNOSIS — O24913 Unspecified diabetes mellitus in pregnancy, third trimester: Secondary | ICD-10-CM

## 2013-01-10 LAB — POCT URINALYSIS DIPSTICK
Glucose, UA: NEGATIVE
Ketones, UA: NEGATIVE
Leukocytes, UA: NEGATIVE
Nitrite, UA: NEGATIVE

## 2013-01-10 NOTE — Patient Instructions (Signed)
Normal Labor and Delivery  Your caregiver must first be sure you are in labor. Signs of labor include:  · You may pass what is called "the mucus plug" before labor begins. This is a small amount of blood stained mucus.  · Regular uterine contractions.  · The time between contractions get closer together.  · The discomfort and pain gradually gets more intense.  · Pains are mostly located in the back.  · Pains get worse when walking.  · The cervix (the opening of the uterus becomes thinner (begins to efface) and opens up (dilates).  Once you are in labor and admitted into the hospital or care center, your caregiver will do the following:  · A complete physical examination.  · Check your vital signs (blood pressure, pulse, temperature and the fetal heart rate).  · Do a vaginal examination (using a sterile glove and lubricant) to determine:  · The position (presentation) of the baby (head [vertex] or buttock first).  · The level (station) of the baby's head in the birth canal.  · The effacement and dilatation of the cervix.  · You may have your pubic hair shaved and be given an enema depending on your caregiver and the circumstance.  · An electronic monitor is usually placed on your abdomen. The monitor follows the length and intensity of the contractions, as well as the baby's heart rate.  · Usually, your caregiver will insert an IV in your arm with a bottle of sugar water. This is done as a precaution so that medications can be given to you quickly during labor or delivery.  NORMAL LABOR AND DELIVERY IS DIVIDED UP INTO 3 STAGES:  First Stage  This is when regular contractions begin and the cervix begins to efface and dilate. This stage can last from 3 to 15 hours. The end of the first stage is when the cervix is 100% effaced and 10 centimeters dilated. Pain medications may be given by   · Injection (morphine, demerol, etc.)  · Regional anesthesia (spinal, caudal or epidural, anesthetics given in different locations of  the spine). Paracervical pain medication may be given, which is an injection of and anesthetic on each side of the cervix.  A pregnant woman may request to have "Natural Childbirth" which is not to have any medications or anesthesia during her labor and delivery.  Second Stage  This is when the baby comes down through the birth canal (vagina) and is born. This can take 1 to 4 hours. As the baby's head comes down through the birth canal, you may feel like you are going to have a bowel movement. You will get the urge to bear down and push until the baby is delivered. As the baby's head is being delivered, the caregiver will decide if an episiotomy (a cut in the perineum and vagina area) is needed to prevent tearing of the tissue in this area. The episiotomy is sewn up after the delivery of the baby and placenta. Sometimes a mask with nitrous oxide is given for the mother to breath during the delivery of the baby to help if there is too much pain. The end of Stage 2 is when the baby is fully delivered. Then when the umbilical cord stops pulsating it is clamped and cut.  Third Stage  The third stage begins after the baby is completely delivered and ends after the placenta (afterbirth) is delivered. This usually takes 5 to 30 minutes. After the placenta is delivered, a medication   is given either by intravenous or injection to help contract the uterus and prevent bleeding. The third stage is not painful and pain medication is usually not necessary. If an episiotomy was done, it is repaired at this time.  After the delivery, the mother is watched and monitored closely for 1 to 2 hours to make sure there is no postpartum bleeding (hemorrhage). If there is a lot of bleeding, medication is given to contract the uterus and stop the bleeding.  Document Released: 07/18/2008 Document Revised: 01/01/2012 Document Reviewed: 07/18/2008  Exit[employer]® Patient Information ©2013 [employer].

## 2013-01-10 NOTE — Progress Notes (Signed)
U/S(38.0wks)-active vtx fetus, BPP-8/8, Fluid wnl, AFI-6.4cm, Ant. Gr. 2 Plac., female fetus

## 2013-01-10 NOTE — Progress Notes (Signed)
Swelling in hands, feet, and private area.

## 2013-01-10 NOTE — Progress Notes (Signed)
  Subjective:    Heather Moyer is a 32 y.o. female being seen today for her obstetrical visit. She is at [redacted]w[redacted]d gestation.She is , GEST  DM WITH Good control,cbg's reveiwed..  M BP 140/80  Wt 328 lb 3.2 oz (148.871 kg)  BMI 56.31 kg/m2 FHT: 143 BPM  Uterine Size: 43 cm  Presentations:     Assessment:    Pregnancy 38 and 3/7 weeks   Plan:   Plans for delivery: VBAC planned Beta strep culture: POSITIVE 3/14.14 Counseling: Consent signed. Fetal testing: discussed Follow up in twice weekly.

## 2013-01-13 ENCOUNTER — Encounter (HOSPITAL_COMMUNITY): Payer: Self-pay | Admitting: Anesthesiology

## 2013-01-13 ENCOUNTER — Encounter (HOSPITAL_COMMUNITY)
Admission: AD | Disposition: A | Discharge status: Home / Self Care | Payer: Self-pay | Source: Ambulatory Visit | Attending: Obstetrics & Gynecology

## 2013-01-13 ENCOUNTER — Inpatient Hospital Stay (HOSPITAL_COMMUNITY): Payer: Medicaid Other | Admitting: Anesthesiology

## 2013-01-13 ENCOUNTER — Encounter (HOSPITAL_COMMUNITY): Payer: Self-pay

## 2013-01-13 ENCOUNTER — Other Ambulatory Visit: Payer: Medicaid Other | Admitting: Obstetrics & Gynecology

## 2013-01-13 ENCOUNTER — Inpatient Hospital Stay (HOSPITAL_COMMUNITY)
Admission: AD | Admit: 2013-01-13 | Discharge: 2013-01-16 | DRG: 765 | Disposition: A | Discharge status: Home / Self Care | Payer: Medicaid Other | Source: Ambulatory Visit | Attending: Obstetrics & Gynecology | Admitting: Obstetrics & Gynecology

## 2013-01-13 DIAGNOSIS — O34599 Maternal care for other abnormalities of gravid uterus, unspecified trimester: Secondary | ICD-10-CM | POA: Diagnosis present

## 2013-01-13 DIAGNOSIS — E669 Obesity, unspecified: Secondary | ICD-10-CM

## 2013-01-13 DIAGNOSIS — N83209 Unspecified ovarian cyst, unspecified side: Secondary | ICD-10-CM | POA: Diagnosis present

## 2013-01-13 DIAGNOSIS — O99892 Other specified diseases and conditions complicating childbirth: Secondary | ICD-10-CM | POA: Diagnosis present

## 2013-01-13 DIAGNOSIS — O4290 Premature rupture of membranes, unspecified as to length of time between rupture and onset of labor, unspecified weeks of gestation: Secondary | ICD-10-CM

## 2013-01-13 DIAGNOSIS — O34219 Maternal care for unspecified type scar from previous cesarean delivery: Secondary | ICD-10-CM | POA: Diagnosis present

## 2013-01-13 DIAGNOSIS — O99214 Obesity complicating childbirth: Secondary | ICD-10-CM

## 2013-01-13 DIAGNOSIS — O139 Gestational [pregnancy-induced] hypertension without significant proteinuria, unspecified trimester: Secondary | ICD-10-CM

## 2013-01-13 DIAGNOSIS — Z22330 Carrier of Group B streptococcus: Secondary | ICD-10-CM

## 2013-01-13 DIAGNOSIS — O99814 Abnormal glucose complicating childbirth: Secondary | ICD-10-CM | POA: Diagnosis present

## 2013-01-13 DIAGNOSIS — Z98891 History of uterine scar from previous surgery: Secondary | ICD-10-CM

## 2013-01-13 HISTORY — DX: Rubella without complication: B06.9

## 2013-01-13 HISTORY — DX: Measles without complication: B05.9

## 2013-01-13 LAB — CBC
HCT: 34.2 % — ABNORMAL LOW (ref 36.0–46.0)
Hemoglobin: 12 g/dL (ref 12.0–15.0)
MCHC: 32 g/dL (ref 30.0–36.0)
MCHC: 31.6 g/dL (ref 30.0–36.0)
RBC: 5.13 MIL/uL — ABNORMAL HIGH (ref 3.87–5.11)
RDW: 18.3 % — ABNORMAL HIGH (ref 11.5–15.5)
WBC: 11.4 10*3/uL — ABNORMAL HIGH (ref 4.0–10.5)

## 2013-01-13 LAB — COMPREHENSIVE METABOLIC PANEL
ALT: 11 U/L (ref 0–35)
Alkaline Phosphatase: 118 U/L — ABNORMAL HIGH (ref 39–117)
CO2: 20 mEq/L (ref 19–32)
GFR calc Af Amer: >90 mL/min (ref >90)
GFR calc non Af Amer: >90 mL/min (ref >90)
Glucose, Bld: 90 mg/dL (ref 70–99)
Potassium: 3.8 mEq/L (ref 3.5–5.1)
Sodium: 135 mEq/L (ref 135–145)

## 2013-01-13 LAB — GLUCOSE, CAPILLARY: Glucose-Capillary: 94 mg/dL (ref 70–99)

## 2013-01-13 LAB — RPR: RPR Ser Ql: NONREACTIVE

## 2013-01-13 SURGERY — Surgical Case
Anesthesia: Epidural | Site: Abdomen | Wound class: Clean Contaminated

## 2013-01-13 MED ORDER — KETOROLAC TROMETHAMINE 60 MG/2ML IM SOLN: 60.0000 mg | Freq: Once | INTRAMUSCULAR | Status: AC | PRN

## 2013-01-13 MED ORDER — IBUPROFEN 600 MG PO TABS
600.0000 mg | ORAL_TABLET | Freq: Four times a day (QID) | ORAL | Status: DC
  Administered 2013-01-14 – 2013-01-16 (×10): 600 mg via ORAL
  Filled 2013-01-13 (×10): qty 1

## 2013-01-13 MED ORDER — KETOROLAC TROMETHAMINE 60 MG/2ML IM SOLN
INTRAMUSCULAR | Status: AC
  Administered 2013-01-13: 60 mg via INTRAMUSCULAR
  Filled 2013-01-13: qty 2

## 2013-01-13 MED ORDER — METOCLOPRAMIDE HCL 5 MG/ML IJ SOLN: 10.0000 mg | Freq: Three times a day (TID) | INTRAMUSCULAR | Status: DC | PRN

## 2013-01-13 MED ORDER — ONDANSETRON HCL 4 MG/2ML IJ SOLN
INTRAMUSCULAR | Status: AC
  Filled 2013-01-13: qty 2

## 2013-01-13 MED ORDER — NALOXONE HCL 0.4 MG/ML IJ SOLN: 0.4000 mg | INTRAMUSCULAR | Status: DC | PRN

## 2013-01-13 MED ORDER — TERBUTALINE SULFATE 1 MG/ML IJ SOLN: 0.2500 mg | Freq: Once | INTRAMUSCULAR | Status: DC | PRN

## 2013-01-13 MED ORDER — LIDOCAINE HCL (PF) 1 % IJ SOLN
INTRAMUSCULAR | Status: DC | PRN
  Administered 2013-01-13 (×4): 4 mL

## 2013-01-13 MED ORDER — NALOXONE HCL 1 MG/ML IJ SOLN: 1.0000 ug/kg/h | INTRAVENOUS | Status: DC | PRN

## 2013-01-13 MED ORDER — FENTANYL 2.5 MCG/ML BUPIVACAINE 1/10 % EPIDURAL INFUSION (WH - ANES)
INTRAMUSCULAR | Status: AC
  Administered 2013-01-13: 14 mL/h via EPIDURAL
  Filled 2013-01-13: qty 125

## 2013-01-13 MED ORDER — OXYTOCIN BOLUS FROM INFUSION: 500.0000 mL | INTRAVENOUS | Status: DC

## 2013-01-13 MED ORDER — KETOROLAC TROMETHAMINE 30 MG/ML IJ SOLN: 30.0000 mg | Freq: Four times a day (QID) | INTRAMUSCULAR | Status: AC | PRN

## 2013-01-13 MED ORDER — CEFAZOLIN SODIUM 1-5 GM-% IV SOLN
1.0000 g | Freq: Three times a day (TID) | INTRAVENOUS | Status: DC
  Filled 2013-01-13 (×2): qty 50

## 2013-01-13 MED ORDER — ONDANSETRON HCL 4 MG/2ML IJ SOLN: 4.0000 mg | Freq: Three times a day (TID) | INTRAMUSCULAR | Status: DC | PRN

## 2013-01-13 MED ORDER — DIBUCAINE 1 % RE OINT: 1.0000 "application " | TOPICAL_OINTMENT | RECTAL | Status: DC | PRN

## 2013-01-13 MED ORDER — DIPHENHYDRAMINE HCL 50 MG/ML IJ SOLN: 25.0000 mg | INTRAMUSCULAR | Status: DC | PRN

## 2013-01-13 MED ORDER — NALBUPHINE HCL 10 MG/ML IJ SOLN: 5.0000 mg | INTRAMUSCULAR | Status: DC | PRN

## 2013-01-13 MED ORDER — CITRIC ACID-SODIUM CITRATE 334-500 MG/5ML PO SOLN
30.0000 mL | ORAL | Status: DC | PRN
  Administered 2013-01-13: 30 mL via ORAL
  Filled 2013-01-13: qty 15

## 2013-01-13 MED ORDER — CLINDAMYCIN PHOSPHATE 900 MG/50ML IV SOLN: 900.0000 mg | Freq: Three times a day (TID) | INTRAVENOUS | Status: DC

## 2013-01-13 MED ORDER — DIPHENHYDRAMINE HCL 25 MG PO CAPS: 25.0000 mg | ORAL_CAPSULE | ORAL | Status: DC | PRN

## 2013-01-13 MED ORDER — ACETAMINOPHEN 325 MG PO TABS: 650.0000 mg | ORAL_TABLET | ORAL | Status: DC | PRN

## 2013-01-13 MED ORDER — IBUPROFEN 600 MG PO TABS: 600.0000 mg | ORAL_TABLET | Freq: Four times a day (QID) | ORAL | Status: DC | PRN

## 2013-01-13 MED ORDER — OXYTOCIN 40 UNITS IN LACTATED RINGERS INFUSION - SIMPLE MED: 62.5000 mL/h | INTRAVENOUS | Status: DC

## 2013-01-13 MED ORDER — MORPHINE SULFATE (PF) 0.5 MG/ML IJ SOLN
INTRAMUSCULAR | Status: DC | PRN
  Administered 2013-01-13: 1 mg via INTRAVENOUS
  Administered 2013-01-13: 4 mg via EPIDURAL

## 2013-01-13 MED ORDER — OXYCODONE-ACETAMINOPHEN 5-325 MG PO TABS
1.0000 | ORAL_TABLET | ORAL | Status: DC | PRN
  Administered 2013-01-14: 2 via ORAL
  Administered 2013-01-15 (×2): 1 via ORAL
  Administered 2013-01-15: 2 via ORAL
  Administered 2013-01-16: 1 via ORAL
  Filled 2013-01-13: qty 2
  Filled 2013-01-13 (×5): qty 1

## 2013-01-13 MED ORDER — OXYTOCIN 40 UNITS IN LACTATED RINGERS INFUSION - SIMPLE MED: 62.5000 mL/h | INTRAVENOUS | Status: AC

## 2013-01-13 MED ORDER — ONDANSETRON HCL 4 MG/2ML IJ SOLN
INTRAMUSCULAR | Status: DC | PRN
  Administered 2013-01-13: 4 mg via INTRAVENOUS

## 2013-01-13 MED ORDER — MEPERIDINE HCL 25 MG/ML IJ SOLN: 6.2500 mg | INTRAMUSCULAR | Status: DC | PRN

## 2013-01-13 MED ORDER — OXYTOCIN 10 UNIT/ML IJ SOLN
INTRAMUSCULAR | Status: AC
  Filled 2013-01-13: qty 4

## 2013-01-13 MED ORDER — MEASLES, MUMPS & RUBELLA VAC ~~LOC~~ INJ
0.5000 mL | INJECTION | Freq: Once | SUBCUTANEOUS | Status: DC
  Filled 2013-01-13: qty 0.5

## 2013-01-13 MED ORDER — SODIUM BICARBONATE 8.4 % IV SOLN
INTRAVENOUS | Status: AC
Start: 2013-01-13 — End: 2013-01-13
  Filled 2013-01-13: qty 50

## 2013-01-13 MED ORDER — CEFAZOLIN SODIUM-DEXTROSE 2-3 GM-% IV SOLR
INTRAVENOUS | Status: DC | PRN
  Administered 2013-01-13: 2 g via INTRAVENOUS

## 2013-01-13 MED ORDER — ONDANSETRON HCL 4 MG PO TABS: 4.0000 mg | ORAL_TABLET | ORAL | Status: DC | PRN

## 2013-01-13 MED ORDER — FENTANYL CITRATE 0.05 MG/ML IJ SOLN
25.0000 ug | INTRAMUSCULAR | Status: DC | PRN
  Administered 2013-01-13: 50 ug via INTRAVENOUS

## 2013-01-13 MED ORDER — FLEET ENEMA 7-19 GM/118ML RE ENEM: 1.0000 | ENEMA | RECTAL | Status: DC | PRN

## 2013-01-13 MED ORDER — FENTANYL CITRATE 0.05 MG/ML IJ SOLN
INTRAMUSCULAR | Status: AC
  Filled 2013-01-13: qty 2

## 2013-01-13 MED ORDER — LACTATED RINGERS IV SOLN
INTRAVENOUS | Status: DC
  Administered 2013-01-13 (×3): via INTRAVENOUS

## 2013-01-13 MED ORDER — WITCH HAZEL-GLYCERIN EX PADS: 1.0000 "application " | MEDICATED_PAD | CUTANEOUS | Status: DC | PRN

## 2013-01-13 MED ORDER — LACTATED RINGERS IV SOLN
INTRAVENOUS | Status: DC
  Administered 2013-01-14: 04:00:00 via INTRAVENOUS

## 2013-01-13 MED ORDER — EPHEDRINE 5 MG/ML INJ
INTRAVENOUS | Status: AC
  Administered 2013-01-13: 10 mg
  Filled 2013-01-13: qty 4

## 2013-01-13 MED ORDER — OXYCODONE-ACETAMINOPHEN 5-325 MG PO TABS: 1.0000 | ORAL_TABLET | ORAL | Status: DC | PRN

## 2013-01-13 MED ORDER — TETANUS-DIPHTH-ACELL PERTUSSIS 5-2.5-18.5 LF-MCG/0.5 IM SUSP: 0.5000 mL | Freq: Once | INTRAMUSCULAR | Status: DC

## 2013-01-13 MED ORDER — SCOPOLAMINE 1 MG/3DAYS TD PT72: 1.0000 | MEDICATED_PATCH | Freq: Once | TRANSDERMAL | Status: DC

## 2013-01-13 MED ORDER — SCOPOLAMINE 1 MG/3DAYS TD PT72
MEDICATED_PATCH | TRANSDERMAL | Status: AC
  Administered 2013-01-13: 1.5 mg via TRANSDERMAL
  Filled 2013-01-13: qty 1

## 2013-01-13 MED ORDER — CEFAZOLIN SODIUM-DEXTROSE 2-3 GM-% IV SOLR
2.0000 g | Freq: Once | INTRAVENOUS | Status: AC
  Administered 2013-01-13: 2 g via INTRAVENOUS
  Filled 2013-01-13: qty 50

## 2013-01-13 MED ORDER — ONDANSETRON HCL 4 MG/2ML IJ SOLN: 4.0000 mg | INTRAMUSCULAR | Status: DC | PRN

## 2013-01-13 MED ORDER — MORPHINE SULFATE 0.5 MG/ML IJ SOLN
INTRAMUSCULAR | Status: AC
  Filled 2013-01-13: qty 10

## 2013-01-13 MED ORDER — PHENYLEPHRINE 40 MCG/ML (10ML) SYRINGE FOR IV PUSH (FOR BLOOD PRESSURE SUPPORT)
PREFILLED_SYRINGE | INTRAVENOUS | Status: AC
  Filled 2013-01-13: qty 5

## 2013-01-13 MED ORDER — OXYTOCIN 40 UNITS IN LACTATED RINGERS INFUSION - SIMPLE MED
1.0000 m[IU]/min | INTRAVENOUS | Status: DC
  Administered 2013-01-13: 4 m[IU]/min via INTRAVENOUS
  Administered 2013-01-13 (×2): 2 m[IU]/min via INTRAVENOUS
  Filled 2013-01-13: qty 1000

## 2013-01-13 MED ORDER — LACTATED RINGERS IV SOLN: 500.0000 mL | INTRAVENOUS | Status: DC | PRN

## 2013-01-13 MED ORDER — PHENYLEPHRINE HCL 10 MG/ML IJ SOLN
INTRAMUSCULAR | Status: DC | PRN
  Administered 2013-01-13: 80 ug via INTRAVENOUS

## 2013-01-13 MED ORDER — DEXTROSE 5 % IV SOLN
Freq: Three times a day (TID) | INTRAVENOUS | Status: AC
  Administered 2013-01-13 – 2013-01-14 (×3): via INTRAVENOUS
  Filled 2013-01-13 (×3): qty 5.25

## 2013-01-13 MED ORDER — LIDOCAINE-EPINEPHRINE (PF) 2 %-1:200000 IJ SOLN
INTRAMUSCULAR | Status: AC
  Filled 2013-01-13: qty 20

## 2013-01-13 MED ORDER — LANOLIN HYDROUS EX OINT: 1.0000 "application " | TOPICAL_OINTMENT | CUTANEOUS | Status: DC | PRN

## 2013-01-13 MED ORDER — OXYTOCIN 10 UNIT/ML IJ SOLN
40.0000 [IU] | INTRAVENOUS | Status: DC | PRN
  Administered 2013-01-13: 40 [IU] via INTRAVENOUS

## 2013-01-13 MED ORDER — ONDANSETRON HCL 4 MG/2ML IJ SOLN: 4.0000 mg | Freq: Four times a day (QID) | INTRAMUSCULAR | Status: DC | PRN

## 2013-01-13 MED ORDER — PRENATAL MULTIVITAMIN CH
1.0000 | ORAL_TABLET | Freq: Every day | ORAL | Status: DC
  Administered 2013-01-14 – 2013-01-16 (×3): 1 via ORAL
  Filled 2013-01-13 (×3): qty 1

## 2013-01-13 MED ORDER — SODIUM BICARBONATE 8.4 % IV SOLN
INTRAVENOUS | Status: DC | PRN
  Administered 2013-01-13: 5 mL via EPIDURAL

## 2013-01-13 MED ORDER — DIPHENHYDRAMINE HCL 50 MG/ML IJ SOLN: 12.5000 mg | INTRAMUSCULAR | Status: DC | PRN

## 2013-01-13 MED ORDER — SODIUM CHLORIDE 0.9 % IJ SOLN: 3.0000 mL | INTRAMUSCULAR | Status: DC | PRN

## 2013-01-13 MED ORDER — MENTHOL 3 MG MT LOZG: 1.0000 | LOZENGE | OROMUCOSAL | Status: DC | PRN

## 2013-01-13 MED ORDER — LIDOCAINE HCL (PF) 1 % IJ SOLN: 30.0000 mL | INTRAMUSCULAR | Status: DC | PRN

## 2013-01-13 MED ORDER — DIPHENHYDRAMINE HCL 25 MG PO CAPS: 25.0000 mg | ORAL_CAPSULE | Freq: Four times a day (QID) | ORAL | Status: DC | PRN

## 2013-01-13 MED ORDER — SIMETHICONE 80 MG PO CHEW
80.0000 mg | CHEWABLE_TABLET | ORAL | Status: DC | PRN
  Administered 2013-01-14 – 2013-01-15 (×2): 80 mg via ORAL

## 2013-01-13 NOTE — Progress Notes (Signed)
ANTIBIOTIC CONSULT NOTE - INITIAL  Pharmacy Consult for Gentamicin Indication: R/O post-op infection; r/o endometritis  Allergies  Allergen Reactions  . Penicillins Swelling and Other (See Comments)    REACTION: genital swelling    Patient Measurements: Height: 5\' 4"  (162.6 cm) Weight: 328 lb 3 oz (148.865 kg) IBW/kg (Calculated) : 54.7 Adjusted Body Weight: 83kg  Vital Signs: Temp: 98 F (36.7 C) (03/24 2145) Temp src: Oral (03/24 1616) BP: 152/81 mmHg (03/24 2145) Pulse Rate: 100 (03/24 2145)  Labs:  Recent Labs  01/13/13 1110 01/13/13 1212 01/13/13 2100  WBC 11.4*  --  14.4*  HGB 12.0  --  10.8*  PLT 266  --  253  LABCREA  --  253.87  --   CREATININE 0.55  --   --    Estimated Creatinine Clearance: 147.3 ml/min (by C-G formula based on Cr of 0.55). Microbiology: Recent Results (from the past 720 hour(s))  OB RESULTS CONSOLE GBS     Status: None   Collection Time    01/13/13 11:57 AM      Result Value Range Status   GBS Positive   Final    Medical History: Past Medical History  Diagnosis Date  . Hypertension   . Gestational diabetes     Medications:  Ancef 2 gram pre-op Clindamycin 900mg  IV q8h Assessment: 31yo w/ SROM with meconium now s/p LTCS. Gentamicin and Clindamycin ordered post-op x 3 doses.  Goal of Therapy:  Gentamicin peaks 6-60mcg/ml and trough < 1 mcg/ml  Plan:  1. Gentamicin 210mg  IV q8h- mixed with Clindamycin, 2. Clindamycin was ordered x 3 doses so will discontinue Gent/Clinda after 3 doses post-op unless otherwise instructed.  Thanks!  Claybon Jabs 01/13/2013,10:09 PM

## 2013-01-13 NOTE — Progress Notes (Signed)
Heather Moyer is a 32 y.o. G2P1001 at [redacted]w[redacted]d by ultrasound admitted for rupture of membranes  Subjective: Doing well.  Objective: BP 130/61  Pulse 109  Temp(Src) 99.5 F (37.5 C) (Axillary)  Resp 18  Ht 5\' 4"  (1.626 m)  Wt 328 lb 3 oz (148.865 kg)  BMI 56.31 kg/m2  SpO2 100%      FHT:  FHR: 140 bpm, variability: moderate,  accelerations:  Present,  decelerations:  Present intermittent variables UC:   regular, every 3 minutes >>  IUPC = 130-160 MVU/9min SVE:   Dilation: 1 Effacement (%): 90 Station: -3 Exam by:: Wynelle Bourgeois, CNM   Labs: Lab Results  Component Value Date   WBC 11.4* 01/13/2013   HGB 12.0 01/13/2013   HCT 37.5 01/13/2013   MCV 73.1* 01/13/2013   PLT 266 01/13/2013    Assessment / Plan: Spontaneous labor, progressing normally  Labor: Progressing normally Preeclampsia:  n/a Fetal Wellbeing:  Category II Pain Control:  Epidural I/D:  n/a Anticipated MOD:  NSVD  Heather Moyer 01/13/2013, 4:03 PM

## 2013-01-13 NOTE — Progress Notes (Signed)
Heather Moyer is a 32 y.o. G2P1001 at 106w3d by admitted for active labor, rupture of membranes  Subjective:   Objective: BP 148/65  Pulse 118  Temp(Src) 98.8 F (37.1 C) (Oral)  Resp 18  Ht 5\' 4"  (1.626 m)  Wt 328 lb 3 oz (148.865 kg)  BMI 56.31 kg/m2  SpO2 100%      FHT:  FHR: 150 bpm, variability: moderate,  accelerations:  Present,  decelerations:  Present large variables / lates when pitocin was running.  Pit stopped at 7 pm UC:   irregular, every 2-4 minutes SVE:   6/90/-3 (out of pelvis); very difficult exam given habitus and station of baby's head.  No progress in last hour.  Labs: Lab Results  Component Value Date   WBC 11.4* 01/13/2013   HGB 12.0 01/13/2013   HCT 37.5 01/13/2013   MCV 73.1* 01/13/2013   PLT 266 01/13/2013    Assessment / Plan: Augmentation of labor with nonreassuring FHT on pitocin, reassuring off pitocin. Will restart pitocin 20 minutes after last deceleration. Continue oxygen.   If decelerations reoccur, will proceed with c/s.   Zigmund Linse H. 01/13/2013, 5:30 PM

## 2013-01-13 NOTE — H&P (Signed)
Heather Moyer is a 32 y.o. female presenting for contractions and bloody show.  Maternal Medical History:  Reason for admission: Rupture of membranes and contractions.  Nausea.  Contractions: Onset was 1-2 hours ago.   Frequency: irregular.   Perceived severity is moderate.    Fetal activity: Perceived fetal activity is normal.   Last perceived fetal movement was within the past hour.    Prenatal complications: Bleeding (show).   Prenatal Complications - Diabetes: gestational. Diabetes is managed by oral agent (monotherapy).      OB History   Grav Para Term Preterm Abortions TAB SAB Ect Mult Living   2 1 1       1      Past Medical History  Diagnosis Date  . Hypertension   . Gestational diabetes    Past Surgical History  Procedure Laterality Date  . Cholecystectomy    . Cesarean section    . Cyst removed     Family History: family history includes Cancer in her other, paternal aunt, and paternal grandfather and Hypertension in her father and mother. Social History:  reports that she has quit smoking. She has never used smokeless tobacco. She reports that she does not drink alcohol or use illicit drugs.   Prenatal Transfer Tool  Maternal Diabetes: Yes:  Diabetes Type:  Insulin/Medication controlled Genetic Screening: Normal Maternal Ultrasounds/Referrals: Normal Fetal Ultrasounds or other Referrals:  None Maternal Substance Abuse:  No Significant Maternal Medications:  Meds include: Other:  Metformin Significant Maternal Lab Results:  Lab values include: Group B Strep positive Other Comments:  Possible Chronic hypertension  Review of Systems  Constitutional: Negative for fever and chills.  Eyes: Negative for blurred vision.  Gastrointestinal: Positive for abdominal pain. Negative for nausea, vomiting, diarrhea and constipation.  Genitourinary: Negative for dysuria.  Neurological: Negative for headaches.    Dilation: 1 Effacement (%): 80;90 Station: -2 Exam  by:: SBeck, RN Blood pressure 161/87, pulse 95, temperature 97.6 F (36.4 C), temperature source Oral, resp. rate 18, height 5\' 4"  (1.626 m).  Maternal Exam:  Uterine Assessment: Contraction strength is moderate.  Contraction frequency is irregular.   Abdomen: Surgical scars: low transverse.   Fundal height is 38.   Estimated fetal weight is 7.5.   Fetal presentation: vertex  Introitus: Normal vulva. Vagina is positive for vaginal discharge (brown show with yellow particles appearing to be meconium).  Ferning test: negative.  Nitrazine test: not done. Amniotic fluid character: meconium stained.  Pelvis: adequate for delivery.   Cervix: Cervix evaluated by sterile speculum exam and digital exam.     Fetal Exam Fetal Monitor Review: Mode: ultrasound.   Baseline rate: 140.  Variability: minimal (<5 bpm).   Pattern: no accelerations and variable decelerations.    Fetal State Assessment: Category II - tracings are indeterminate.     Physical Exam  Constitutional: She is oriented to person, place, and time. She appears well-developed and well-nourished. She appears distressed (with labor pain).  HENT:  Head: Normocephalic.  Cardiovascular: Normal rate.   Respiratory: Effort normal.  GI: Soft. She exhibits no distension and no mass. There is no tenderness. There is no rebound and no guarding.  Genitourinary: Uterus normal. Vaginal discharge (brown show with yellow particles appearing to be meconium) found.  Dilation: 1 Effacement (%): 80;90 Cervical Position: Anterior Station: -2 Presentation: Vertex Exam by:: SBeck, RN   Musculoskeletal: Normal range of motion. She exhibits edema (hands trace, ankles 1+).  Neurological: She is alert and oriented to person, place,  and time. She has normal reflexes. She displays normal reflexes (no clonus).  Skin: Skin is warm and dry.  Psychiatric: She has a normal mood and affect.    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

## 2013-01-13 NOTE — Anesthesia Postprocedure Evaluation (Signed)
  Anesthesia Post-op Note  Patient: Heather Moyer  Procedure(s) Performed: Procedure(s): CESAREAN SECTION (N/A)  Patient Location: PACU  Anesthesia Type:Epidural  Level of Consciousness: awake, alert  and oriented  Airway and Oxygen Therapy: Patient Spontanous Breathing  Post-op Pain: none  Post-op Assessment: Post-op Vital signs reviewed, Patient's Cardiovascular Status Stable, Respiratory Function Stable, Patent Airway, No signs of Nausea or vomiting and Pain level controlled  Post-op Vital Signs: Reviewed and stable  Complications: No apparent anesthesia complications

## 2013-01-13 NOTE — Op Note (Signed)
Heather Moyer PROCEDURE DATE: 01/13/2013 - 02/14/2013  PREOPERATIVE DIAGNOSIS: Intrauterine pregnancy at  [redacted]w[redacted]d weeks gestation with failed VBAC  POSTOPERATIVE DIAGNOSIS: The same  PROCEDURE:    Low Transverse Cesarean Section  SURGEON:  Dr. Elsie Lincoln  ASSISTANT: Dr. Mitchel Honour, Dr. Noland Fordyce, Dr. Gregor Hams  INDICATIONS: Heather Moyer is a 32 y.o. [id number] at [redacted]w[redacted]d for cesarean section secondary to failure to progress.  The risks of cesarean section discussed with the patient included but were not limited to: bleeding which may require transfusion or reoperation; infection which may require antibiotics; injury to bowel, bladder, ureters or other surrounding organs; injury to the fetus; need for additional procedures including hysterectomy in the event of a life-threatening hemorrhage; placental abnormalities wth subsequent pregnancies, incisional problems, thromboembolic phenomenon and other postoperative/anesthesia complications. The patient concurred with the proposed plan, giving informed written consent for the procedure.    FINDINGS:  Viable female infant in cephalic presentation, Apgars 8,9 Thick meconium amniotic fluid with stained membranes.  Intact placenta, three vessel cord.  Uterus encased in adhesions involving the anterior abdominal wall, right ovary can be palpated but not visualized, the left ovary can not be visualized nor palpated., Art cord gas 7.07 .   ANESTHESIA:    Epidural ESTIMATED BLOOD LOSS: 800 ml SPECIMENS: Placenta sent to pathology COMPLICATIONS: None immediate  PROCEDURE IN DETAIL:  The patient received intravenous antibiotics and had sequential compression devices applied to her lower extremities while in the preoperative area.  She was then taken to the operating room where epidural anesthesia was dosed up to surgical level and was found to be adequate. She was then placed in a dorsal supine position with a leftward tilt, and prepped and draped  in a sterile manner.  A foley catheter was placed into her bladder and attached to constant gravity.  After an adequate timeout was performed, a Pfannenstiel skin incision was made with scalpel and carried through to the underlying layer of fascia. The fascia was incised in the midline and this incision was extended bilaterally using the Mayo scissors. Kocher clamps were applied to the superior aspect of the fascial incision and the underlying rectus muscles were dissected off bluntly. A similar process was carried out on the inferior aspect of the facial incision. The rectus muscles were separated in the midline bluntly.  The uterus was densely adhered to the uterus with no plane identified.  Eventually enough dissection was done so that the lower uterine segment and underlying fetal head was palpated.  Bladder and peritoneum was still identifiable at this level.  A transverse hysterotomy was made with a scalpel and extended bilaterally bluntly.  The infant was successfully delivered, and cord was clamped and cut and infant was handed over to awaiting neonatology team. The placenta delivered intact with three-vessel cord. The uterus was cleared of clot and debris.  The hysterotomy was closed with 0 vicryl.  A second imbricating suture of 0-Vicryl was used to reinforce the incision and aid in hemostasis.  Hemostasis was achieved on the rectus muscles with 2-0 monocryl.  The fascia was closed with 0-Vicryl in a running fashion with good restoration of anatomy.  The subcutaneus tissue was copiously irrigated and re-approximated with 0 plain suture.  The skin was closed with staples.  Pt tolerated the procedure will.  All counts were correct x2.  Pt went to the recovery room in stable condition.

## 2013-01-13 NOTE — Progress Notes (Addendum)
Called to room due to decels.   FHR with fair to minimum variability exhibited decelerations of fetal heart rate to 70s.  Oxygen placed. Cervix rechecked by Dr Penne Lash.  Discussion of possible actions was presented by Dr Penne Lash.  Since patient is a previous C/S, it was discussed that while we would prefer a vaginal delivery, if the fetal heart rate remained nonreassuring, we may need to proceed with Repeat C/S. Risk/benefits of VBAC/TOLAC were reviewed. Pt agreeable.  Cervix was 1cm still  Will plan to insert foley bulb and probably IUPC.

## 2013-01-13 NOTE — Transfer of Care (Signed)
Immediate Anesthesia Transfer of Care Note  Patient: Heather Moyer  Procedure(s) Performed: Procedure(s): CESAREAN SECTION (N/A)  Patient Location: PACU  Anesthesia Type:Epidural  Level of Consciousness: sedated  Airway & Oxygen Therapy: Patient Spontanous Breathing  Post-op Assessment: Report given to PACU RN  Post vital signs: Reviewed and stable  Complications: No apparent anesthesia complications

## 2013-01-13 NOTE — Anesthesia Preprocedure Evaluation (Addendum)
Anesthesia Evaluation  Patient identified by MRN, date of birth, ID band Patient awake    Reviewed: Allergy & Precautions, H&P , NPO status , Patient's Chart, lab work & pertinent test results, reviewed documented beta blocker date and time   History of Anesthesia Complications Negative for: history of anesthetic complications  Airway Mallampati: IV TM Distance: >3 FB Neck ROM: full    Dental  (+) Teeth Intact   Pulmonary neg pulmonary ROS,  breath sounds clear to auscultation        Cardiovascular hypertension (evaluating for PIH), Rhythm:regular Rate:Normal     Neuro/Psych  Headaches, PSYCHIATRIC DISORDERS (depression/anxiety)    GI/Hepatic negative GI ROS, Neg liver ROS,   Endo/Other  diabetes, Gestational, Oral Hypoglycemic AgentsMorbid obesity  Renal/GU negative Renal ROS     Musculoskeletal   Abdominal   Peds  Hematology negative hematology ROS (+)   Anesthesia Other Findings   Reproductive/Obstetrics (+) Pregnancy (h/o c/s x1, attempting VBAC --> fetal intolerance to labor, failure to descend)                          Anesthesia Physical Anesthesia Plan  ASA: III and emergent  Anesthesia Plan: Epidural   Post-op Pain Management:    Induction:   Airway Management Planned:   Additional Equipment:   Intra-op Plan:   Post-operative Plan:   Informed Consent: I have reviewed the patients History and Physical, chart, labs and discussed the procedure including the risks, benefits and alternatives for the proposed anesthesia with the patient or authorized representative who has indicated his/her understanding and acceptance.     Plan Discussed with: Surgeon and CRNA  Anesthesia Plan Comments:        Anesthesia Quick Evaluation

## 2013-01-13 NOTE — MAU Note (Signed)
Pt states u/c's began last pm, early am. Went to bathroom and mucus plug came out. Now ctx's are q2? Minutes apart. Denies bleeding or gush of fluid.

## 2013-01-13 NOTE — MAU Note (Signed)
Bloody mucus present with particulate noted. MWilliams, CNM made aware.

## 2013-01-13 NOTE — Anesthesia Procedure Notes (Signed)
Epidural Patient location during procedure: OB Start time: 01/13/2013 12:26 PM  Staffing Performed by: anesthesiologist   Preanesthetic Checklist Completed: patient identified, site marked, surgical consent, pre-op evaluation, timeout performed, IV checked, risks and benefits discussed and monitors and equipment checked  Epidural Patient position: sitting Prep: site prepped and draped and DuraPrep Patient monitoring: continuous pulse ox and blood pressure Approach: midline Injection technique: LOR air  Needle:  Needle type: Tuohy  Needle gauge: 17 G Needle length: 9 cm and 9 Needle insertion depth: 10 cm Catheter type: closed end flexible Catheter size: 19 Gauge Catheter at skin depth: 15 cm Test dose: negative  Assessment Events: blood not aspirated, injection not painful, no injection resistance, negative IV test and no paresthesia  Additional Notes Discussed risk of headache, infection, bleeding, nerve injury and failed or incomplete block.  Patient voices understanding and wishes to proceed.  Epidural placed with minimal difficulty due to body habitus.  No paresthesia.  Patient tolerated procedure well with no apparent complications.  Jasmine December, MD Reason for block:procedure for pain

## 2013-01-13 NOTE — Progress Notes (Signed)
Heather Moyer is a 32 y.o. G2P1001 at [redacted]w[redacted]d by ultrasound admitted for rupture of membranes  Subjective: Comfortable with epidural  Objective: BP 130/61  Pulse 109  Temp(Src) 99.5 F (37.5 C) (Axillary)  Resp 18  Ht 5\' 4"  (1.626 m)  Wt 328 lb 3 oz (148.865 kg)  BMI 56.31 kg/m2  SpO2 100%      FHT:  FHR: 140 bpm, variability: moderate,  accelerations:  Present,  decelerations:  Present intermittent variable decels UC:   regular, every 3 minutes SVE:   Dilation: 1 Effacement (%): 90 Station: -3 Exam by:: Wynelle Bourgeois, CNM   Labs: Lab Results  Component Value Date   WBC 11.4* 01/13/2013   HGB 12.0 01/13/2013   HCT 37.5 01/13/2013   MCV 73.1* 01/13/2013   PLT 266 01/13/2013    Assessment / Plan: Spontaneous labor, progressing normally  Labor: Progressing normally Preeclampsia:  n/a Fetal Wellbeing:  Category II Pain Control:  Epidural I/D:  n/a Anticipated MOD:  NSVD  IUPC inserted >>  UCs range from 45-23mm strong Foley bulb catheter inserted  [facility] 01/13/2013, 3:58 PM

## 2013-01-13 NOTE — Progress Notes (Signed)
Heather Moyer is a 32 y.o. G2P1001 at [redacted]w[redacted]d by LMP admitted for active labor  Subjective: Pt mentions improved comfort with epidural in place.  Occasional contractions.   Objective: BP 121/62  Pulse 113  Temp(Src) 98.8 F (37.1 C) (Oral)  Resp 18  Ht 5\' 4"  (1.626 m)  Wt 148.865 kg (328 lb 3 oz)  BMI 56.31 kg/m2  SpO2 99%     Foley bulb: dislodged with traction  FHT:  FHR: 125 bpm, variability: moderate,  accelerations:  Present,  decelerations:  Present episodic that improve with position change UC:   regular, every 6-8 minutes SVE:   Dilation: 5 Effacement (%): 90 Station: -3 Exam by:: Dr. Penne Moyer   Labs: Lab Results  Component Value Date   WBC 11.4* 01/13/2013   HGB 12.0 01/13/2013   HCT 37.5 01/13/2013   MCV 73.1* 01/13/2013   PLT 266 01/13/2013    Assessment / Plan: Augmentation of labor, progressing well -- foley bulb dislodged with traction -- begin low dose pitocin augmentation   Labor: Progressing on Pitocin, will continue to increase then AROM Fetal Wellbeing:  Category II Pain Control:  Epidural I/D:  GBS positive tx w/ cefazolin Anticipated MOD:  NSVD  Heather Moyer 01/13/2013, 4:22 PM

## 2013-01-13 NOTE — Progress Notes (Signed)
Patient ID: Heather Moyer, female   DOB: 29-Oct-1980, 32 y.o.   MRN: [id number]   Pt rechecked.  Cervix is 6 cm but cervix is swollen to 50%.  Cervix was 90% effaced.  Fetal head position feeels OP but difficult to reach sutures due to maternal obesity and station being very high.  FHT having last 4 contractions on pitocin.  Discussed need for rpt c/s  The risks of cesarean section discussed with the patient included but were not limited to: bleeding which may require transfusion or reoperation; infection which may require antibiotics; injury to bowel, bladder, ureters or other surrounding organs; injury to the fetus; need for additional procedures including hysterectomy in the event of a life-threatening hemorrhage; placental abnormalities wth subsequent pregnancies, incisional problems, thromboembolic phenomenon and other postoperative/anesthesia complications. The patient concurred with the proposed plan, giving informed written consent for the procedure.   Patient already receiving ancef for GBS prophylaxis.  Pt will SCDs placed in the OR.

## 2013-01-14 LAB — CBC
MCV: 73 fL — ABNORMAL LOW (ref 78.0–100.0)
Platelets: 232 10*3/uL (ref 150–400)
RDW: 18.4 % — ABNORMAL HIGH (ref 11.5–15.5)
WBC: 12.3 10*3/uL — ABNORMAL HIGH (ref 4.0–10.5)

## 2013-01-14 MED ORDER — EPHEDRINE 5 MG/ML INJ
10.0000 mg | INTRAVENOUS | Status: DC | PRN
  Filled 2013-01-14: qty 2

## 2013-01-14 MED ORDER — PHENYLEPHRINE 40 MCG/ML (10ML) SYRINGE FOR IV PUSH (FOR BLOOD PRESSURE SUPPORT)
80.0000 ug | PREFILLED_SYRINGE | INTRAVENOUS | Status: DC | PRN
  Filled 2013-01-14: qty 2

## 2013-01-14 MED ORDER — DIPHENHYDRAMINE HCL 50 MG/ML IJ SOLN: 12.5000 mg | INTRAMUSCULAR | Status: DC | PRN

## 2013-01-14 MED ORDER — FENTANYL 2.5 MCG/ML BUPIVACAINE 1/10 % EPIDURAL INFUSION (WH - ANES): 14.0000 mL/h | INTRAMUSCULAR | Status: DC | PRN

## 2013-01-14 MED ORDER — LACTATED RINGERS IV SOLN: 500.0000 mL | Freq: Once | INTRAVENOUS | Status: DC

## 2013-01-14 NOTE — Progress Notes (Signed)
UR chart review completed.  

## 2013-01-14 NOTE — Progress Notes (Signed)
Subjective: Postpartum Day 1: Cesarean Delivery Patient reports tolerating PO, + flatus and no problems voiding. She has been ambulatory to the bathroom. Pain well-controlled. No nausea, vomiting, chest pain, or dyspnea. Deciding on Mirena vs. Implanon for birth control. She is bottle feeding only.  Objective: Vital signs in last 24 hours: Temp:  [97.6 F (36.4 C)-99.5 F (37.5 C)] 98.3 F (36.8 C) (03/25 0657) Pulse Rate:  [60-126] 107 (03/25 0500) Resp:  [14-26] 18 (03/25 0500) BP: (86-177)/(35-103) 123/74 mmHg (03/25 0657) SpO2:  [95 %-100 %] 96 % (03/25 0657) Weight:  [148.865 kg (328 lb 3 oz)] 148.865 kg (328 lb 3 oz) (03/24 1152)  Physical Exam:  General: alert, cooperative, no distress and morbidly obese Lochia: appropriate Uterine Fundus: firm Incision: no dehiscence, no significant erythema, slight drainage of blood DVT Evaluation: No evidence of DVT seen on physical exam. Negative Homan's sign. No cords or calf tenderness. No significant calf/ankle edema.   Recent Labs  01/13/13 2100 01/14/13 0505  HGB 10.8* 9.8*  HCT 34.2* 30.9*   Capillary Glucose (01/13/13 at 2115): 94  Assessment/Plan: Status post Cesarean section. Doing well postoperatively.  Continue current care.  Dorrene German 01/14/2013, 7:09 AM  Evaluation and management procedures were performed by PA-S under my supervision/collaboration. Chart reviewed, patient examined by me and I agree with management and plan. Encouraged ambulation and IS. Danae Orleans, CNM 01/14/2013 10:07 AM

## 2013-01-15 ENCOUNTER — Encounter: Payer: Self-pay | Admitting: *Deleted

## 2013-01-15 MED ORDER — DOCUSATE SODIUM 100 MG PO CAPS: 100.0000 mg | ORAL_CAPSULE | Freq: Every day | ORAL | Status: DC | PRN

## 2013-01-15 NOTE — H&P (Signed)
Pt seen  and examined.  Agree with above note.  Tennille Montelongo H. 01/15/2013 11:56 AM

## 2013-01-15 NOTE — Progress Notes (Signed)
Neonatal Intensive Care Unit [facility]  [address] [place], [place]  [address] [phone]  NICU Daily Progress Note 01/15/2013 1:09 PM   Patient Active Problem List  Diagnosis  . HYPERLIPIDEMIA  . HYPOCALCEMIA  . OBESITY NOS  . MICROCYTIC ANEMIA  . DEPRESSION/ANXIETY  . MIGRAINE HEADACHE  . HYPERTENSION  . OVARIAN CYST, RIGHT  . GESTATIONAL DIABETES  . SKIN RASH  . History of cesarean section, low transverse     Gestational Age: <None> blank   Wt Readings from Last 3 Encounters:  01/13/13 148865 g (328 lb 3 oz)  01/10/13 148871 g (328 lb 3.2 oz)  06/27/12 129729 g (286 lb)    Temp:  [36.6 C (97.9 F)-36.8 C (98.2 F)] 36.6 C (97.9 F) (03/26 0612) Pulse Rate:  [98-103] 103 (03/26 0612) Resp:  [18-20] 18 (03/26 0612) BP: (116-131)/(74-75) 129/74 mmHg (03/26 0612) SpO2:  [98 %] 98 % (03/25 1430)  03/25 0701 - 03/26 0700 In: -  Out: 1300 [Urine:1300]      Scheduled Meds: . ibuprofen  600 mg Oral Q6H  . lactated ringers  500 mL Intravenous Once  . measles, mumps and rubella vaccine  0.5 mL Subcutaneous Once  . prenatal multivitamin  1 tablet Oral Q1200  . TDaP  0.5 mL Intramuscular Once   Continuous Infusions: . lactated ringers 125 mL/hr at 01/14/13 0403  . naLOXone [facility]) adult infusion for PRURITIS     PRN Meds:.dibucaine, diphenhydrAMINE, diphenhydrAMINE, diphenhydrAMINE, diphenhydrAMINE, lanolin, menthol-cetylpyridinium, metoCLOPramide (REGLAN) injection, nalbuphine, nalbuphine, naLOXone (NARCAN) adult infusion for PRURITIS, naloxone, ondansetron (ZOFRAN) IV, ondansetron (ZOFRAN) IV, ondansetron, oxyCODONE-acetaminophen, simethicone, sodium chloride, witch hazel-glycerin  Lab Results  Component Value Date   WBC 12.3* 01/14/2013   HGB 9.8* 01/14/2013   HCT 30.9* 01/14/2013   PLT 232 01/14/2013     Lab Results  Component Value Date   NA 135 01/13/2013   K 3.8 01/13/2013   CL 101 01/13/2013   CO2 20 01/13/2013    BUN 6 01/13/2013   CREATININE 0.55 01/13/2013    Physical Exam General: active, alert Skin: clear HEENT: anterior fontanel soft and flat CV: Rhythm regular, pulses WNL, cap refill WNL, grade 1-2/6 murmur GI: Abdomen soft, non distended, non tender, bowel sounds present GU: normal anatomy Resp: breath sounds clear and equal, chest symmetric, WOB normal Neuro: active, alert, responsive, normal suck, normal cry, symmetric, tone as expected for age and state   Cardiovascular: Hemodynamically stable, soft murmur heard on exam.  GI/FEN: Started on ad lib feeds today with breastfeeding and formula as needed. Will follow intake and adjust IVF accordingly.Voiding and stooling.  Hepatic: Bili planned in the AM, will continue to follow clinically, no signficant jaundice noted on exam today.  Infectious Disease: Plan procalcitonin at 72 hours to help determine length of antibiotic treatment, on antibiotics based on clinical presentation.  Metabolic/Endocrine/Genetic: Temp and glucose screens stable.  Neurological: He will need a hearing screen after discharge.  Respiratory: Stable in RA.  Social: Continue to update and support family.   Leighton Roach NNP-BC Lesly Dukes, MD (Attending)

## 2013-01-15 NOTE — Progress Notes (Addendum)
Post Partum Day 2 Subjective: up ad lib, voiding, tolerating PO, + flatus and 4/10 RLQ pain, improves with ibuprofen and percocet.  Objective: Blood pressure 129/74, pulse 103, temperature 97.9 F (36.6 C), temperature source Oral, resp. rate 18, height 5\' 4"  (1.626 m), weight 148.865 kg (328 lb 3 oz), SpO2 98.00%, unknown if currently breastfeeding.  Physical Exam:  General: alert, cooperative and appears stated age Lochia: appropriate Uterine Fundus: firm Incision: healing well, no significant drainage, no dehiscence, no significant erythema DVT Evaluation: No evidence of DVT seen on physical exam. No cords or calf tenderness.   Recent Labs  01/13/13 2100 01/14/13 0505  HGB 10.8* 9.8*  HCT 34.2* 30.9*    Assessment/Plan: Plan for discharge tomorrow -- pt request d/c tomorrow 2/2 pain and infant staying in nicu   LOS: 2 days   Gregor Hams 01/15/2013, 7:46 AM   I have seen and examined this patient and I agree with the above. [place], Birtie Fellman 8:08 AM 01/15/2013

## 2013-01-16 ENCOUNTER — Encounter (HOSPITAL_COMMUNITY): Payer: Self-pay | Admitting: Unknown Physician Specialty

## 2013-01-16 MED ORDER — DOCUSATE SODIUM 100 MG PO CAPS: 100.0000 mg | ORAL_CAPSULE | Freq: Two times a day (BID) | ORAL | Status: DC

## 2013-01-16 MED ORDER — OXYCODONE-ACETAMINOPHEN 5-325 MG PO TABS: 1.0000 | ORAL_TABLET | ORAL | Status: DC | PRN

## 2013-01-16 MED ORDER — IBUPROFEN 200 MG PO TABS: 600.0000 mg | ORAL_TABLET | Freq: Three times a day (TID) | ORAL | Status: DC | PRN

## 2013-01-16 NOTE — Clinical Social Work Maternal (Signed)
Clinical Social Work Department PSYCHOSOCIAL ASSESSMENT - MATERNAL/CHILD 01/15/2013  Patient:  Heather Moyer,Heather Moyer  Account Number:  [id number]  Admit Date:  01/13/2013  Childs Name:   Heather Moyer    Clinical Social Worker:  Heather Hellums, LCSW   Date/Time:  01/15/2013 01:00 PM  Date Referred:  01/15/2013   Referral source  NICU     Referred reason  NICU   Other referral source:    I:  FAMILY / HOME ENVIRONMENT Child's legal guardian:  PARENT  Guardian - Name Guardian - Age Guardian - Address  Heather Moyer [address] [place], [place] [address]  Heather Moyer  same   Other household support members/support persons Name Relationship DOB  Heather Moyer DAUGHTER 8   Other support:   MOB states she has a good support system.  She stated her mother and FOB as her main support people.    II  PSYCHOSOCIAL DATA Information Source:  Patient Interview  Financial and Community Resources Employment:   MOB works for [employer]  FOB works in a group home   Financial resources:  Medicaid If Medicaid - County:  [place] Other  WIC   School / Grade:   Maternity Care Coordinator / Child Services Coordination / Early Interventions:  Cultural issues impacting care:   None indicated    III  STRENGTHS Strengths  Adequate Resources  Compliance with medical plan  Home prepared for Child (including basic supplies)  Other - See comment  Supportive family/friends  Understanding of illness   Strength comment:  Pediatric follow-up will be with Heather Moyer at [facility]   IV  RISK FACTORS AND CURRENT PROBLEMS Current Problem:  None   Risk Factor & Current Problem Patient Issue Family Issue Risk Factor / Current Problem Comment   N N     V  SOCIAL WORK ASSESSMENT  CSW met with MOB in her first floor room/121 to introduce myself and complete assessment due to NICU admission.  MOB was very pleasant and welcomed CSW into her room.  She appears to be  coping well and has a good understanding of baby's medical situation.  She is hopeful that he will not have to be in the NICU long, but understands he may need to stay in the hospital past the time of her discharge and states no issues with transportation if that happens.  CSW informed her of the option to room in with baby the night before his scheduled discharge and she is interested in doing this.  CSW asked her to let baby's RN know of her interest.  MOB reports having everything she needs for baby at home and a good support system.  She and FOB are together and this is his first baby.  MOB has an 8 year old daughter at home, who is staying with Heather Moyer while MOB is in the hospital.  CSW discussed PPD signs and symptoms and common emotions related to the NICU experience.  MOB states no hx with PPD and seemed engaged and interested in information CSW was giving.  She reports feeling comfortable talking with her doctor if symptoms arise.  CSW gave "Feelings After Birth" handout and contact information.  CSW asked MOB to let CSW know of any questions, concerns or needs while baby is in the hospital.  MOB was very appreciative.  CSW has no social concerns at this time.   VI SOCIAL WORK PLAN Social Work Plan  Psychosocial Support/Ongoing Assessment of Needs   Type of pt/family   education:   PPD signs and symptoms  Emotions related to the NICU experience   If child protective services report - county:   If child protective services report - date:   Information/referral to community resources comment:   No referral needs identified at this time.   Other social work plan:    

## 2013-01-16 NOTE — Discharge Summary (Signed)
Obstetric Discharge Summary Reason for Admission: onset of labor Prenatal Procedures: NST Intrapartum Procedures: cesarean: low cervical, transverse and GBS prophylaxis Postpartum Procedures: antibiotics Complications-Operative and Postpartum: none Hemoglobin  Date Value Range Status  01/14/2013 9.8* 12.0 - 15.0 g/dL Final     HCT  Date Value Range Status  01/14/2013 30.9* 36.0 - 46.0 % Final    Physical Exam:  General: alert, cooperative and appears stated age Cardiac: RRR, no m/g/r Lungs: CLAB, no w/r/r Lochia: appropriate Uterine Fundus: firm Incision: healing well, no significant drainage, no dehiscence, no significant erythema DVT Evaluation: No evidence of DVT seen on physical exam. No cords or calf tenderness.  Discharge Diagnoses: Term Pregnancy-delivered  Discharge Information: Date: 01/16/2013 Activity: pelvic rest Diet: routine Medications: Ibuprofen, Colace and Percocet Condition: stable Instructions: refer to practice specific booklet Discharge to: home Follow-up Information   Follow up with [facility] In 5 days. (Follow up for post-op care in 3-5 days, or next week as possible.  )    Contact information:   [address] [place] [address] [phone]      Newborn Data: Live born female  Birth Weight: 6 lb 2.6 oz (2795 g) APGAR: 8, 9  Home with after NICU stay, anticipate discharge on 01/17/13.  Gregor Hams 01/16/2013, 7:50 AM  I have seen and examined this patient and agree the above assessment. CRESENZO-DISHMAN,Azzam Mehra 01/16/2013 7:56 AM

## 2013-01-17 ENCOUNTER — Other Ambulatory Visit: Payer: Medicaid Other | Admitting: Obstetrics & Gynecology

## 2013-01-17 LAB — TYPE AND SCREEN
ABO/RH(D): A POS
Antibody Screen: NEGATIVE
Unit division: 0

## 2013-01-21 ENCOUNTER — Encounter: Payer: Self-pay | Admitting: Obstetrics & Gynecology

## 2013-01-21 ENCOUNTER — Ambulatory Visit (INDEPENDENT_AMBULATORY_CARE_PROVIDER_SITE_OTHER): Payer: Medicaid Other | Admitting: Obstetrics & Gynecology

## 2013-01-21 VITALS — BP 120/80 | Ht 64.0 in | Wt 312.0 lb

## 2013-01-21 DIAGNOSIS — Z9889 Other specified postprocedural states: Secondary | ICD-10-CM

## 2013-01-21 NOTE — Patient Instructions (Signed)

## 2013-01-21 NOTE — Progress Notes (Signed)
Patient ID: Heather Moyer, female   DOB: 1980/11/12, 32 y.o.   MRN: [id number]  HPI: Patient returns for routine postoperative follow-up having undergone repeat Caesarean section on 3.24.2014. The patient's early postoperative recovery while in the hospital was notable for unremarkable. Since hospital discharge the patient reports no problems.   Current Outpatient Prescriptions  Medication Sig Dispense Refill  . docusate sodium (COLACE) 100 MG capsule Take 1 capsule (100 mg total) by mouth 2 (two) times daily.  10 capsule  0  . ibuprofen (ADVIL) 200 MG tablet Take 3 tablets (600 mg total) by mouth every 8 (eight) hours as needed for pain.  30 tablet  0  . oxyCODONE-acetaminophen (ROXICET) 5-325 MG per tablet Take 1 tablet by mouth every 4 (four) hours as needed for pain.  30 tablet  0  . Prenatal Vit-Fe Fumarate-FA (PRENATAL MULTIVITAMIN) TABS Take 1 tablet by mouth daily at 12 noon.       No current facility-administered medications for this visit.    Physical Exam: incision clean dry intact staples removed gentian violet was placed  Diagnostic Tests: none  Impression: Normal post op visit  Plan: No pain meds needed RTW 4 weeks

## 2013-02-19 ENCOUNTER — Encounter: Payer: Medicaid Other | Admitting: Obstetrics and Gynecology

## 2013-02-25 ENCOUNTER — Ambulatory Visit: Payer: Medicaid Other | Admitting: Obstetrics & Gynecology

## 2013-03-03 ENCOUNTER — Ambulatory Visit: Payer: Medicaid Other | Admitting: Obstetrics & Gynecology

## 2013-03-03 IMAGING — US US TRANSVAGINAL NON-OB
1 series · 13 of 25 positions shown · non-contrast
Comparison: Pelvic ultrasound 12/05/2006.

CLINICAL DATA: First trimester pregnancy with vaginal bleeding.
LMP 04/12/2012.

OBSTETRIC <14 WK US AND TRANSVAGINAL OB US
TECHNIQUE: Both transabdominal and transvaginal ultrasound
examinations were performed for complete evaluation of the
gestation as well as the maternal uterus, adnexal regions, and
pelvic cul-de-sac.  Transvaginal technique was performed to assess
early pregnancy.

[Series 1: us transvaginal non-ob · 0.12mm/px · 13 of 55 slices shown]
[im 1/55]
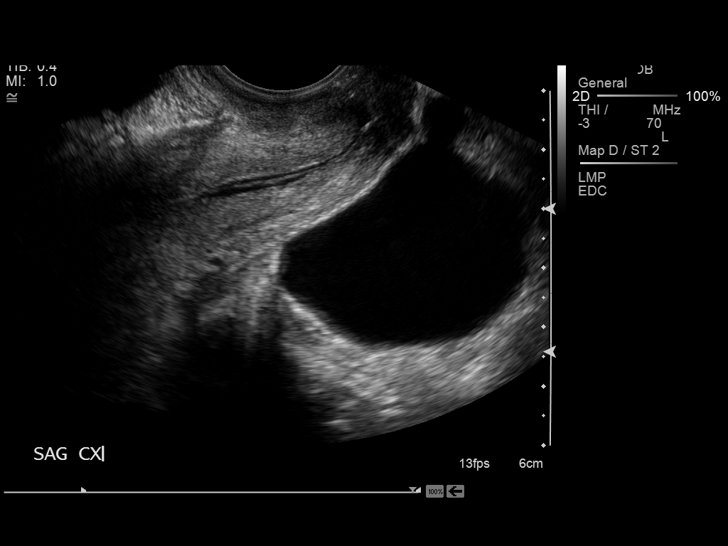
[im 5/55]
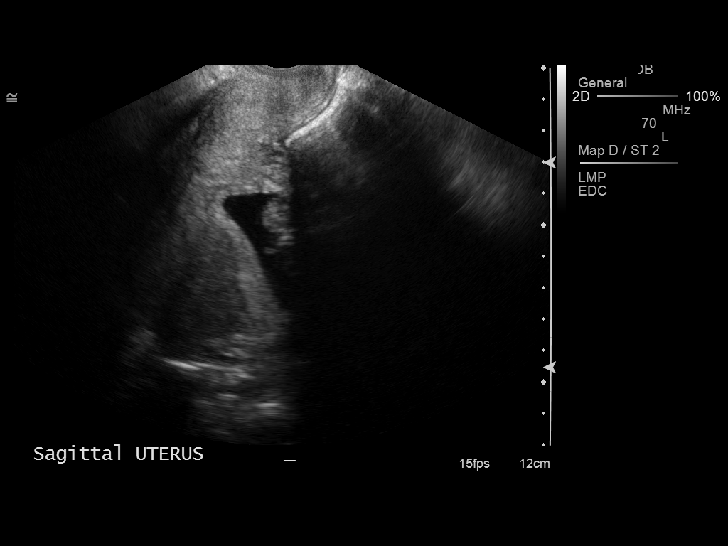
[im 10/55]
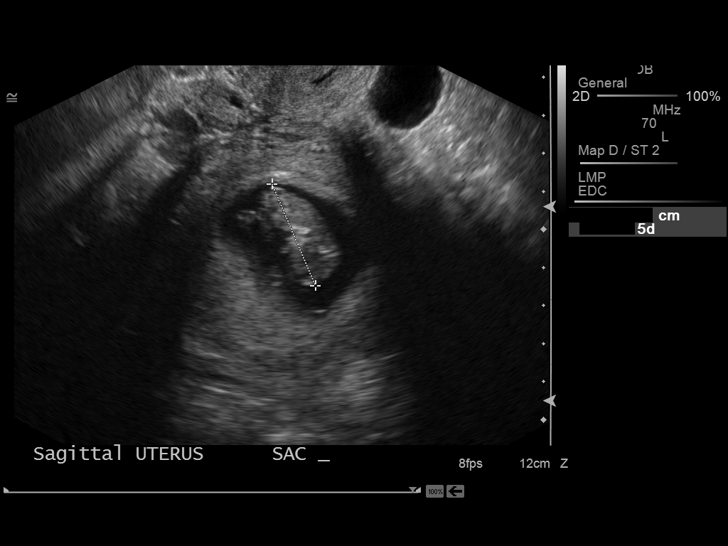
[im 14/55]
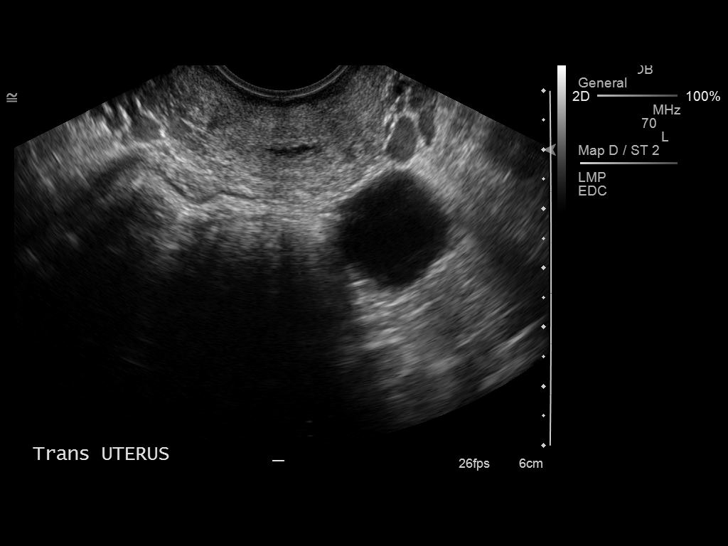
[im 19/55]
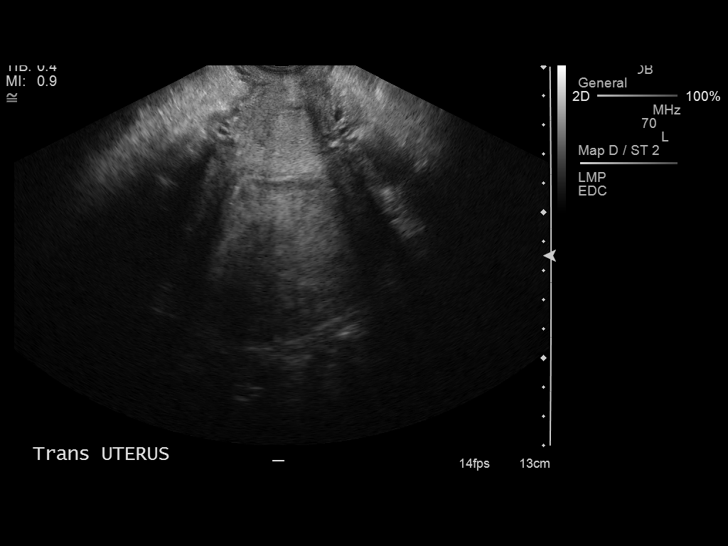
[im 23/55]
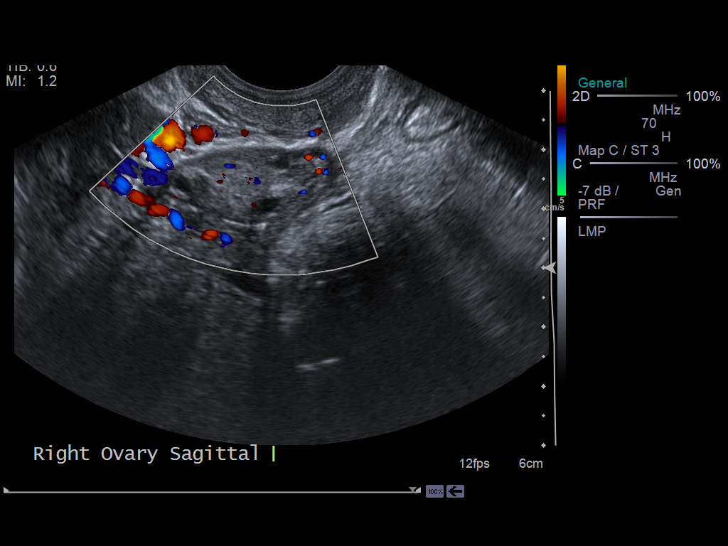
[im 28/55]
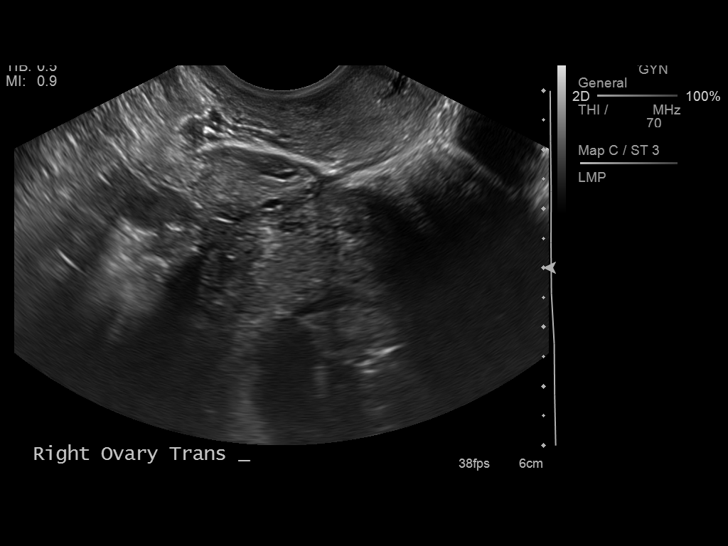
[im 32/55]
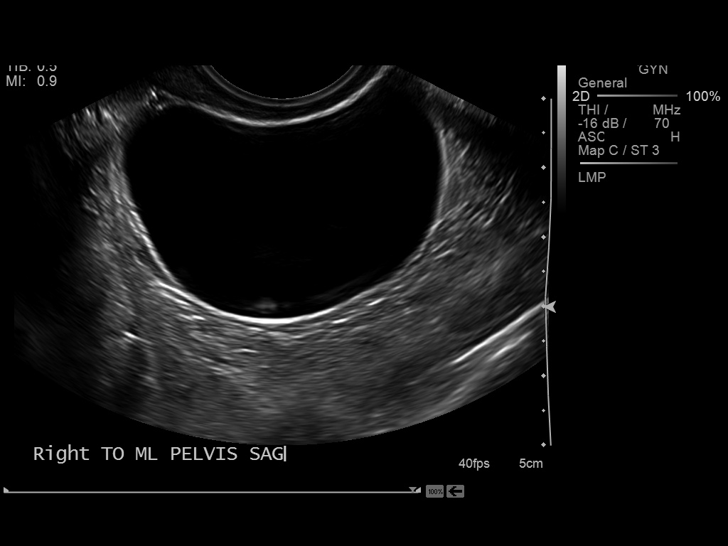
[im 37/55]
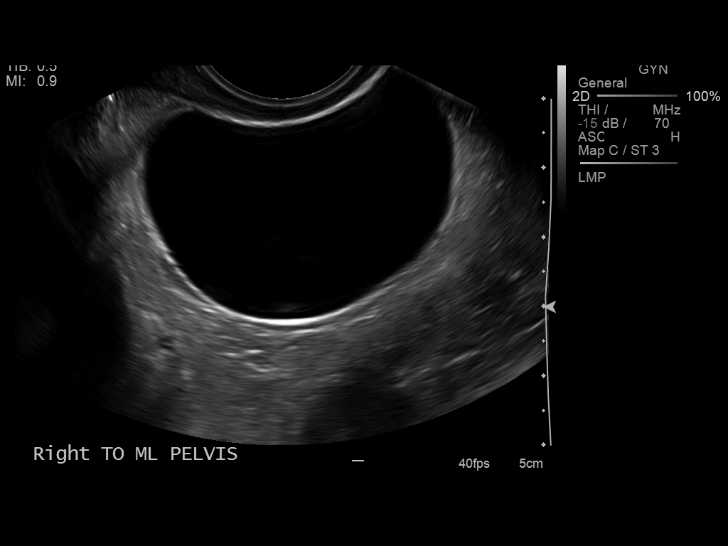
[im 41/55]
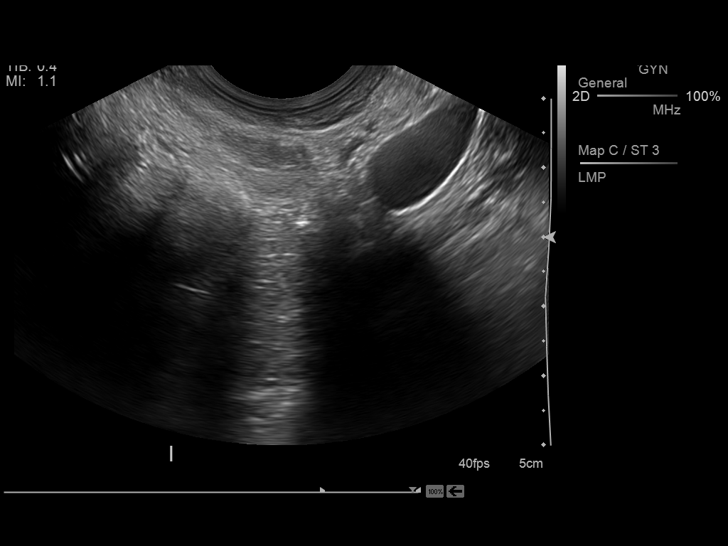
[im 46/55]
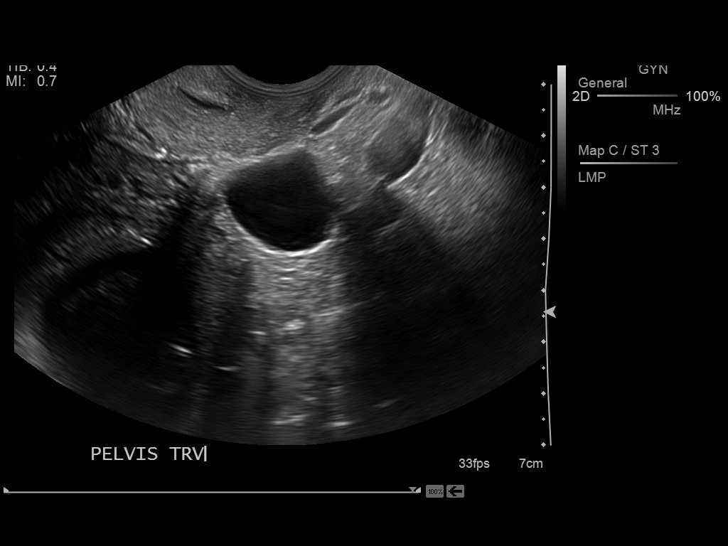
[im 50/55]
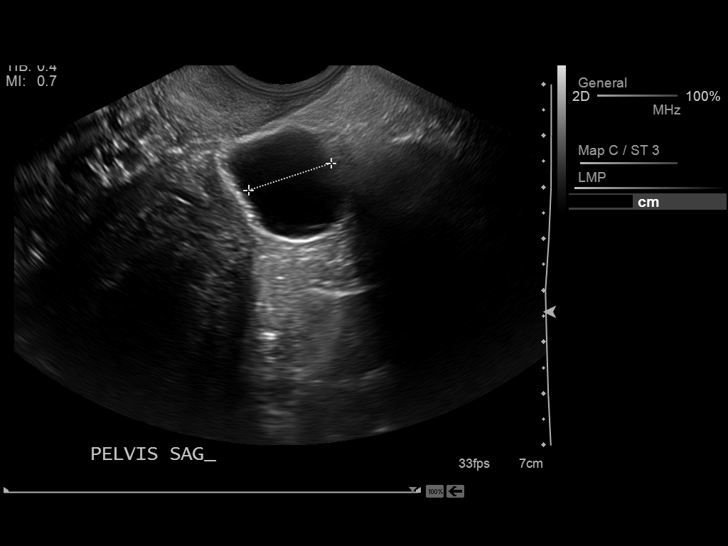
[im 55/55]
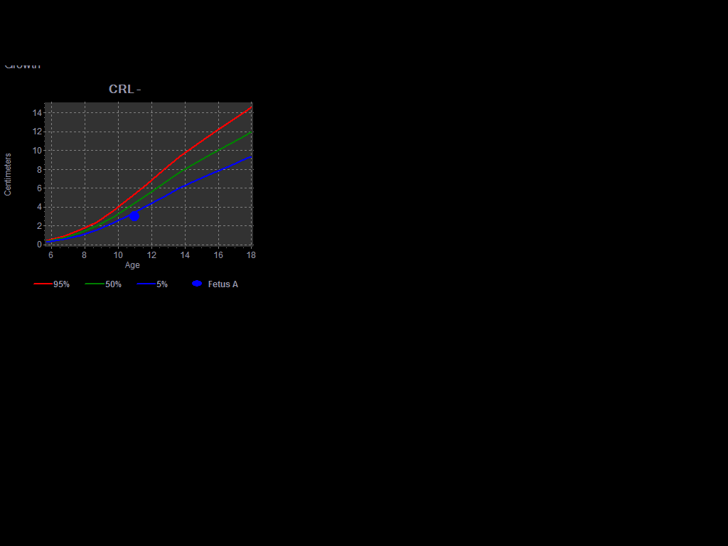

[13 of 25 positions shown; findings below may reference images not displayed]

Intrauterine gestational sac:  Visualized/normal in shape.
Yolk sac: Visualized.
Embryo: Visualized.
Cardiac Activity: Visualized.
Heart Rate: 171 bpm

CRL: 29.1  mm  9 w  5 d             US EDC: 01/25/2013

Maternal uterus/adnexae:
There is a small subchorionic hematoma.  Both maternal ovaries are
visualized and appear normal.  However, there are two simple cysts
within the pelvis which appears separate from the ovaries.  These
measure 4.5 x 2.9 x 4.6 cm on the right and 1.8 x 1.9 x 1.7 cm on
the left.  The prior pelvic ultrasound demonstrated a right para
ovarian cyst measuring up to 2.6 cm in diameter.
IMPRESSION: 1.  Single live intrauterine gestation with best estimated
gestational age of 9 weeks 5 days.
2.  Small subchorionic hematoma.
3.  Apparent bilateral paraovarian cysts, larger on the right.
These have enlarged from 5447 pelvic ultrasound.  Postpartum pelvic
ultrasound followup recommended.

## 2013-03-10 ENCOUNTER — Encounter: Payer: Self-pay | Admitting: Adult Health

## 2013-03-10 ENCOUNTER — Ambulatory Visit (INDEPENDENT_AMBULATORY_CARE_PROVIDER_SITE_OTHER): Payer: Medicaid Other | Admitting: Adult Health

## 2013-03-10 DIAGNOSIS — Z3009 Encounter for other general counseling and advice on contraception: Secondary | ICD-10-CM

## 2013-03-10 NOTE — Patient Instructions (Addendum)
NO sex til after nexplanon insertion Return in 2 weeks for nexplanon Sing up for my chart

## 2013-03-10 NOTE — Progress Notes (Signed)
Patient ID: Heather Moyer, female   DOB: [birth year], 32 y.o.   MRN: [id number]   Delivery Date:01/13/13 Method of Delivery: C-section  Sexual Activity since delivery: Yes with condom  Method of Feeding: Bottle feeding  Number of weeks bleeding post delivery: Had LMP 02/24/13  Review of Systems: Patient denies any headaches, blurred vision, shortness of breath, chest pain, abdominal pain, problems with bowel movements, urination, or intercourse. No joint pain, not moody or depressed  She desires nexplanon, NO sex til after insertion, will check QHCG in am of 5/28 and insert nexplanon in pm.  Reviewed past medical,surgical, social and family history. Reviewed medications and allergies.  Depression Score: 7  Pelvic Exam:   External genitalia is normal in appearance.  The vagina is normal in appearance. The cervix is bulbous.  Uterus is felt to be normal size, shape, and contour, well involuted.  No adnexal masses or tenderness noted. Abdomen: incision healed, no pain  Blood pressure 150/80, height 5\' 4"  (1.626 m), weight 302 lb (136.986 kg), last menstrual period 02/24/2013, not currently breastfeeding.  Impression:  Status post delivery, post partum check, depression screening, contraceptive management.  Plan:   No sex  Return  5/28 for insertion of nexplanon

## 2013-03-19 ENCOUNTER — Encounter: Payer: Self-pay | Admitting: Adult Health

## 2013-03-19 ENCOUNTER — Other Ambulatory Visit: Payer: Medicaid Other

## 2013-03-19 ENCOUNTER — Ambulatory Visit (INDEPENDENT_AMBULATORY_CARE_PROVIDER_SITE_OTHER): Payer: Medicaid Other | Admitting: Adult Health

## 2013-03-19 VITALS — BP 132/80 | Ht 64.0 in | Wt 300.0 lb

## 2013-03-19 DIAGNOSIS — Z3202 Encounter for pregnancy test, result negative: Secondary | ICD-10-CM

## 2013-03-19 DIAGNOSIS — Z309 Encounter for contraceptive management, unspecified: Secondary | ICD-10-CM

## 2013-03-19 DIAGNOSIS — Z3200 Encounter for pregnancy test, result unknown: Secondary | ICD-10-CM

## 2013-03-19 DIAGNOSIS — Z30017 Encounter for initial prescription of implantable subdermal contraceptive: Secondary | ICD-10-CM

## 2013-03-19 HISTORY — DX: Encounter for initial prescription of implantable subdermal contraceptive: Z30.017

## 2013-03-19 NOTE — Patient Instructions (Addendum)
Use condoms x 2-4 weeks, keep clean and dry x 24 hours, no heavy lifting, keep steri strips on x 72 hours, Keep pressure dressing on x 24 hours. Follow up prn problems. Return in 4 weeks for colpo with Dr. Emelda Fear

## 2013-03-19 NOTE — Progress Notes (Signed)
Subjective:     Patient ID: Heather Moyer, female   DOB: [birth year], 32 y.o.   MRN: [id number]  HPI Heather Moyer is a 32 year old black female in today for nexplanon insertion.  Review of Systems No complaints Reviewed past medical,surgical, social and family history. Reviewed medications and allergies.     Objective:   Physical Exam Blood pressure 132/80, height 5\' 4"  (1.626 m), weight 300 lb (136.079 kg), last menstrual period 02/24/2013, not currently breastfeeding.   She had a QHCG <1 this am, last sex before last period.  Consent signed. Left arm cleansed with betadine, and injected with 1.5 cc 2% lidocaine and waited til numb. Nexplanon easily inserted and steri strips applied. Pressure dressing applied.Rod was easily palpated by pt and provider. Assessment:      Nexplanon insertion Contraceptive management History abnormal pap during pregnancy    Plan:      Use condoms x 2-4 weeks, keep clean and dry x 24 hours, no heavy lifting, keep steri strips on x 72 hours, Keep pressure dressing on x 24 hours. Follow up prn problems.   Return in 4 weeks for colpo with Dr. Emelda Fear

## 2013-04-14 ENCOUNTER — Encounter: Payer: Medicaid Other | Admitting: Obstetrics and Gynecology

## 2013-04-30 ENCOUNTER — Encounter: Payer: Medicaid Other | Admitting: Obstetrics and Gynecology

## 2013-05-06 ENCOUNTER — Encounter: Payer: Self-pay | Admitting: Obstetrics and Gynecology

## 2013-05-06 ENCOUNTER — Ambulatory Visit (INDEPENDENT_AMBULATORY_CARE_PROVIDER_SITE_OTHER): Payer: Medicaid Other | Admitting: Obstetrics and Gynecology

## 2013-05-06 VITALS — BP 148/70 | Ht 64.0 in | Wt 302.0 lb

## 2013-05-06 DIAGNOSIS — IMO0001 Reserved for inherently not codable concepts without codable children: Secondary | ICD-10-CM | POA: Insufficient documentation

## 2013-05-06 DIAGNOSIS — R87810 Cervical high risk human papillomavirus (HPV) DNA test positive: Secondary | ICD-10-CM

## 2013-05-06 DIAGNOSIS — R87610 Atypical squamous cells of undetermined significance on cytologic smear of cervix (ASC-US): Secondary | ICD-10-CM

## 2013-05-06 DIAGNOSIS — Z3202 Encounter for pregnancy test, result negative: Secondary | ICD-10-CM

## 2013-05-06 LAB — POCT URINE PREGNANCY: Preg Test, Ur: NEGATIVE

## 2013-05-06 NOTE — Progress Notes (Signed)
Patient ID: Heather Moyer, female   DOB: November 28, 1980, 32 y.o.   MRN: [id number] Patient here today for a Colposcopy for an abnormal pap - ASCUS 07/17/2013.GYNECOLOGY CLINIC PROCEDURE NOTE  32 y.o. [id number] here for colposcopy for ASCUS with POSITIVE high risk HPV pap smear on 07/17/12. Discussed role for HPV in cervical dysplasia, need for surveillance.  Patient given informed consent, signed copy in the chart, time out was performed.  Placed in lithotomy position. Cervix viewed with speculum and colposcope after application of acetic acid.   Colposcopy adequate? Yes difficult due to very anterior cervix, difficult to access, completed only with difficulty  no visible lesions; biopsies obtained.   All specimens were labelled and sent to pathology.Given post procedure instructions.    Routine preventative health maintenance measures emphasized. Will repeat pap smear in 3 months, as it has been almost a year since abnormal pap.

## 2013-05-06 NOTE — Patient Instructions (Signed)
HPV Vaccine Questions and Answers WHAT IS HUMAN PAPILLOMAVIRUS (HPV)? HPV is a virus that can lead to cervical cancer; vulvar and vaginal cancers; penile cancer; anal cancer and genital warts (warts in the genital areas). More than 1 vaccine is available to help you or your child with protection against HPV. Your caregiver can talk to you about which one might give you the best protection. WHO SHOULD GET THIS VACCINE? The HPV vaccine is most effective when given before the onset of sexual activity.  This vaccine is recommended for girls 6 or 33 years of age. It can be given to girls as young as 33 years old.  HPV vaccine can be given to males, 9 through 33 years of age, to reduce the likelihood of acquiring genital warts.  HPV vaccine can be given to males and females aged 4 through 26 years to prevent anal cancer. HPV vaccine is not generally recommended after age 37, because most individuals have been exposed to the HPV virus by that age. HOW EFFECTIVE IS THIS VACCINE?  The vaccine is generally effective in preventing cervical; vulvar and vaginal cancers; penile cancer; anal cancer and genital warts caused by 4 types of HPV. The vaccine is less effective in those individuals who are already infected with HPV. This vaccine does not treat existing HPV, genital warts, pre-cancers or cancers. WILL SEXUALLY ACTIVE INDIVIDUALS BENEFIT FROM THE VACCINE? Sexually active individuals may still benefit from the vaccine but may get less benefit due to previous HPV exposure. HOW AND WHEN IS THE VACCINE ADMINISTERED? The vaccine is given in a series of 3 injections (shots) over a 6 month period in both males and females. The exact timing depends on which specific vaccine your caregiver recommends for you. IS THE HPV VACCINE SAFE?  The federal government has approved the HPV vaccine as safe and effective. This vaccine was tested in both males and females in many countries around the world. The most common  side effect is soreness at the injection site. Since the drug became approved, there has been some concern about patients passing out after being vaccinated, which has led to a recommendation of a 15 minute waiting period following vaccination. This practice may decrease the small risk of passing out. Additionally there is a rare risk of anaphylaxis (an allergic reaction) to the vaccine and a risk of a blood clot among individuals with specific risk factors for a blood clot. DOES THIS VACCINE CONTAIN THIMEROSAL OR MERCURY? No. There is no thimerosal or mercury in the HPV vaccine. It is made of proteins from the outer coat of the virus (HPV). There is no infectious material in this vaccine. WILL GIRLS/WOMEN WHO HAVE BEEN VACCINATED STILL NEED CERVICAL CANCER SCREENING? Yes. There are 3 reasons why women will still need regular cervical cancer screening. First, the vaccine will NOT provide protection against all types of HPV that cause cervical cancer. Vaccinated women will still be at risk for some cancers. Second, some women may not get all required doses of the vaccine (or they may not get them at the recommended times). Therefore, they may not get the vaccine's full benefits. Third, women may not get the full benefit of the vaccine if they receive it after they have already acquired any of the 4 types of HPV. WILL THE HPV VACCINE BE COVERED BY INSURANCE PLANS? While some insurance companies may cover the vaccine, others may not. Most large group insurance plans cover the costs of recommended vaccines. WHAT KIND OF GOVERNMENT PROGRAMS  MAY BE AVAILABLE TO COVER HPV VACCINE? Federal health programs such as Vaccines for Children [facility]) will cover the HPV vaccine. The [facility] program provides free vaccines to children and adolescents under 3 years of age, who are either uninsured, Medicaid-eligible, American [place] or [place] Native. There are over 45,000 sites that provide [facility] vaccines including hospital, private  and public clinics. The [facility] program also allows children and adolescents to get VFC vaccines through [facility] or [facility] if their private health insurance does not cover the vaccine. Some states also provide free or low-cost vaccines, at public health clinics, to people without health insurance coverage for vaccines. GENITAL HPV: WHY IS HPV IMPORTANT? Genital HPV is the most common virus transmitted through genital contact, most often during vaginal and anal sex. About 40 types of HPV can infect the genital areas of men and women. While most HPV types cause no symptoms and go away on their own, some types can cause cervical cancer in women. These types also cause other less common genital cancers, including cancers of the penis, anus, vagina (birth canal), and vulva (area around the opening of the vagina). Other types of HPV can cause genital warts in men and women. HOW COMMON IS HPV?   At least 50% of sexually active people will get HPV at some time in their lives. HPV is most common in young women and men who are in their late teens and early 22s.  Anyone who has ever had genital contact with another person can get HPV. Both men and women can get it and pass it on to their sex partners without realizing it. IS HPV THE SAME THING AS HIV OR HERPES? HPV is NOT the same as HIV or Herpes (Herpes simplex virus or HSV). While these are all viruses that can be sexually transmitted, HIV and HSV do not cause the same symptoms or health problems as HPV. CAN HPV AND ITS ASSOCIATED DISEASES BE TREATED? There is no treatment for HPV. There are treatments for the health problems that HPV can cause, such as genital warts, cervical cell changes, and cancers of the cervix (lower part of the womb), vulva, vagina and anus.  HOW IS HPV RELATED TO CERVICAL CANCER? Some types of HPV can infect a woman's cervix and cause the cells to change in an abnormal way. Most of the time, HPV goes  away on its own. When HPV is gone, the cervical cells go back to normal. Sometimes, HPV does not go away. Instead, it lingers (persists) and continues to change the cells on a woman's cervix. These cell changes can lead to cancer over time if they are not treated. ARE THERE OTHER WAYS TO PREVENT CERVICAL CANCER? Regular Pap tests and follow-up can prevent most, but not all, cases of cervical cancer. Pap tests can detect cell changes (or pre-cancers) in the cervix before they turn into cancer. Pap tests can also detect most, but not all, cervical cancers at an early, curable stage. Most women diagnosed with cervical cancer have either never had a Pap test, or not had a Pap test in the last 5 years. There is also an HPV DNA test available for use with the Pap test as part of cervical cancer screening. This test may be ordered for women over 30 or for women who get an unclear (borderline) Pap test result. While this test can tell if a woman has HPV on her cervix, it cannot tell which types of HPV she has.  If the HPV DNA test is negative for HPV DNA, then screening may be done every 3 years. If the HPV DNA test is positive for HPV DNA, then screening should be done every 6 to 12 months. OTHER QUESTIONS ABOUT THE HPV VACCINE WHAT HPV TYPES DOES THE VACCINE PROTECT AGAINST? The HPV vaccine protects against the HPV types that cause most (70%) cervical cancers (types 16 and 18), most (78%) anal cancers (types 16 and 18) and the two HPV types that cause most (90%) genital warts (types 6 and 11). WHAT DOES THE VACCINE NOT PROTECT AGAINST?  Because the vaccine does not protect against all types of HPV, it will not prevent all cases of cervical cancer, anal cancer, other genital cancers or genital warts. About 30% of cervical cancers are not prevented with vaccination, so it will be important for women to continue screening for cervical cancer (regular Pap tests). Also, the vaccine does not prevent about 10% of genital  warts nor will it prevent other sexually transmitted infections (STIs), including HIV. Therefore, it will still be important for sexually active adults to practice safe sex to reduce exposure to HPV and other STI's. HOW LONG DOES VACCINE PROTECTION LAST? WILL A BOOSTER SHOT BE NEEDED? So far, studies have followed women for 5 years and found that they are still protected. Currently, additional (booster) doses are not recommended. More research is being done to find out how long protection will last, and if a booster vaccine is needed years later.  WHY IS THE HPV VACCINE RECOMMENDED AT SUCH A YOUNG AGE? Ideally, males and females should get the vaccine before they are sexually active since this vaccine is most effective in individuals who have not yet acquired any of the HPV vaccine types. Individuals who have not been infected with any of the 4 types of HPV will get the full benefits of the vaccine.  SHOULD PREGNANT WOMEN BE VACCINATED? The vaccine is not recommended for pregnant women. There has been limited research looking at vaccine safety for pregnant women and their developing fetus. Studies suggest that the vaccine has not caused health problems during pregnancy, nor has it caused health problems for the infant. Pregnant women should complete their pregnancy before getting the vaccine. If a woman finds out she is pregnant after she has started getting the vaccine series, she should complete her pregnancy before finishing the 3 doses. SHOULD BREASTFEEDING MOTHERS BE VACCINATED? Mothers nursing their babies may get the vaccine because the virus is inactivated and will not harm the mother or baby. WILL INDIVIDUALS BE PROTECTED AGAINST HPV AND RELATED DISEASES, EVEN IF THEY DO NOT GET ALL 3 DOSES? It is not yet known how much protection individuals will get from receiving only 1 or 2 doses of the vaccine. For this reason, it is very important that individuals get all 3 doses of the vaccine. WILL  CHILDREN BE REQUIRED TO BE VACCINATED TO ENTER SCHOOL? There are no federal laws that require children or adolescents to get vaccinated. All school entry laws are state laws so they vary from state to state. To find out what vaccines are needed for children or adolescents to enter school in your state, check with your state health department or board of education. ARE THERE OTHER WAYS TO PREVENT HPV? The only sure way to prevent HPV is to abstain from all sexual activity. Sexually active adults can reduce their risk by being in a mutually monogamous relationship with someone who has had no other sex partners.  But even individuals with only 1 lifetime sex partner can get HPV, if their partner has had a previous partner with HPV. It is unknown how much protection condoms provide against HPV, since areas that are not covered by a condom can be exposed to the virus. However, condoms may reduce the risk of genital warts and cervical cancer. They can also reduce the risk of HIV and some other sexually transmitted infections (STIs), when used consistently and correctly (all the time and the right way). Document Released: 10/09/2005 Document Revised: 01/01/2012 Document Reviewed: 06/04/2009 [facility] Patient Information 2014 [place], [place]. HPV Test The HPV (human papillomavirus) test is used to screen for high-risk types with HPV infection. HPV is a group of about 100 related viruses, of which 40 types are genital viruses. Most HPV viruses cause infections that usually resolve without treatment within 2 years. Some HPV infections can cause skin and genital warts (condylomata). HPV types 16, 18, 31 and 45 are considered high-risk types of HPV. High-risk types of HPV do not usually cause visible warts, but if untreated, may lead to cancers of the outlet of the womb (cervix) or anus. An HPV test identifies the DNA (genetic) strands of the HPV infection. Because the test identifies the DNA strands, the test is also  referred to as the HPV DNA test. Although HPV is found in both males and females, the HPV test is only used to screen for cervical cancer in females. This test is recommended for females:  With an abnormal Pap test.  After treatment of an abnormal Pap test.  Aged 71 and older.  After treatment of a high-risk HPV infection. The HPV test may be done at the same time as a Pap test in females over the age of 75. Both the HPV and Pap test require a sample of cells from the cervix. PREPARATION FOR TEST  You may be asked to avoid douching, tampons, or vaginal medicines for 48 hours before the HPV test. You will be asked to urinate before the test. For the HPV test, you will need to lie on an exam table with your feet in stirrups. A spatula will be inserted into the vagina. The spatula will be used to swab the cervix for a cell and mucus sample. The sample will be further evaluated in a lab under a microscope. NORMAL FINDINGS  Normal: High-risk HPV is not found.  Ranges for normal findings may vary among different laboratories and hospitals. You should always check with your doctor after having lab work or other tests done to discuss the meaning of your test results and whether your values are considered within normal limits. MEANING OF TEST An abnormal HPV test means that high-risk HPV is found. Your caregiver may recommend further testing. Your caregiver will go over the test results with you. He or she will and discuss the importance and meaning of your results, as well as treatment options and the need for additional tests, if necessary. OBTAINING THE RESULTS  It is your responsibility to obtain your test results. Ask the lab or department performing the test when and how you will get your results. Document Released: 11/03/2004 Document Revised: 01/01/2012 Document Reviewed: 07/19/2005 [facility] Patient Information 2014 [place], [place].

## 2013-05-30 ENCOUNTER — Telehealth: Payer: Self-pay | Admitting: Obstetrics & Gynecology

## 2013-05-30 NOTE — Telephone Encounter (Signed)
Pt states having light and heavy vaginal bleeding past 3 weeks. Pt has nexplanon. Informed pt abnormal vaginal bleeding common with nexplanon. Pt states feeling fatique also. Call transferred to front staff for an appt to be made.

## 2013-06-04 ENCOUNTER — Ambulatory Visit (INDEPENDENT_AMBULATORY_CARE_PROVIDER_SITE_OTHER): Payer: Medicaid Other | Admitting: Adult Health

## 2013-06-04 ENCOUNTER — Encounter: Payer: Self-pay | Admitting: Adult Health

## 2013-06-04 VITALS — BP 138/70 | Ht 64.0 in | Wt 305.0 lb

## 2013-06-04 DIAGNOSIS — N949 Unspecified condition associated with female genital organs and menstrual cycle: Secondary | ICD-10-CM

## 2013-06-04 DIAGNOSIS — R5383 Other fatigue: Secondary | ICD-10-CM

## 2013-06-04 DIAGNOSIS — N938 Other specified abnormal uterine and vaginal bleeding: Secondary | ICD-10-CM

## 2013-06-04 DIAGNOSIS — Z3202 Encounter for pregnancy test, result negative: Secondary | ICD-10-CM

## 2013-06-04 HISTORY — DX: Other specified abnormal uterine and vaginal bleeding: N93.8

## 2013-06-04 LAB — COMPREHENSIVE METABOLIC PANEL
Albumin: 3.6 g/dL (ref 3.5–5.2)
Alkaline Phosphatase: 87 U/L (ref 39–117)
BUN: 10 mg/dL (ref 6–23)
Calcium: 8.9 mg/dL (ref 8.4–10.5)
Creat: 0.75 mg/dL (ref 0.50–1.10)
Glucose, Bld: 80 mg/dL (ref 70–99)
Potassium: 3.9 mEq/L (ref 3.5–5.3)

## 2013-06-04 LAB — CBC
HCT: 32.9 % — ABNORMAL LOW (ref 36.0–46.0)
Hemoglobin: 10.2 g/dL — ABNORMAL LOW (ref 12.0–15.0)
MCHC: 31 g/dL (ref 30.0–36.0)
MCV: 64.9 fL — ABNORMAL LOW (ref 78.0–100.0)

## 2013-06-04 LAB — POCT URINE PREGNANCY: Preg Test, Ur: NEGATIVE

## 2013-06-04 MED ORDER — MEGESTROL ACETATE 40 MG PO TABS: ORAL_TABLET | ORAL | Status: DC

## 2013-06-04 NOTE — Progress Notes (Signed)
Subjective:     Patient ID: Heather Moyer, female   DOB: [birth year], 32 y.o.   MRN: [id number]  HPI Heather Moyer is a 32 year old black female in complaining of bleeding since nexplanon inserted in May.And she has fatigue now.No pain.  Review of Systems Positives in HPI Reviewed past medical,surgical, social and family history. Reviewed medications and allergies.     Objective:   Physical Exam BP 138/70  Ht 5\' 4"  (1.626 m)  Wt 305 lb (138.347 kg)  BMI 52.33 kg/m2  LMP 04/22/2013   Skin warm and dry.Pelvic: external genitalia is normal in appearance, vagina: period like blood, cervix:smooth and bulbous, uterus: normal size, shape and contour, non tender, no masses felt, adnexa: no masses or tenderness noted.Urine pregnancy test negative.  Assessment:     DUB with nexplanon Fatigue    Plan:      Rx megace 40 mg #45 3 x 5 days then 2 x 5 days then 1 daily with 1 refill   Check CBC,CMP,TSH Review handout on DUB Follow up prn

## 2013-06-04 NOTE — Patient Instructions (Addendum)

## 2013-06-05 ENCOUNTER — Telehealth: Payer: Self-pay | Admitting: Adult Health

## 2013-06-05 NOTE — Telephone Encounter (Signed)
Pt aware of labs and need to take MVw/fe and OTC fe 1 daily

## 2013-07-07 ENCOUNTER — Other Ambulatory Visit: Payer: Self-pay | Admitting: Adult Health

## 2013-08-04 ENCOUNTER — Other Ambulatory Visit: Payer: Self-pay | Admitting: Adult Health

## 2013-08-06 ENCOUNTER — Other Ambulatory Visit (HOSPITAL_COMMUNITY)
Admission: RE | Admit: 2013-08-06 | Discharge: 2013-08-06 | Disposition: A | Discharge status: Home / Self Care | Payer: Medicaid Other | Source: Ambulatory Visit | Attending: Adult Health | Admitting: Adult Health

## 2013-08-06 ENCOUNTER — Ambulatory Visit (INDEPENDENT_AMBULATORY_CARE_PROVIDER_SITE_OTHER): Payer: Medicaid Other | Admitting: Adult Health

## 2013-08-06 ENCOUNTER — Encounter: Payer: Self-pay | Admitting: Adult Health

## 2013-08-06 VITALS — BP 148/80 | Ht 64.0 in | Wt 313.0 lb

## 2013-08-06 DIAGNOSIS — Z1151 Encounter for screening for human papillomavirus (HPV): Secondary | ICD-10-CM | POA: Insufficient documentation

## 2013-08-06 DIAGNOSIS — Z01419 Encounter for gynecological examination (general) (routine) without abnormal findings: Secondary | ICD-10-CM | POA: Insufficient documentation

## 2013-08-06 DIAGNOSIS — F32A Depression, unspecified: Secondary | ICD-10-CM | POA: Insufficient documentation

## 2013-08-06 DIAGNOSIS — Z0142 Encounter for cervical smear to confirm findings of recent normal smear following initial abnormal smear: Secondary | ICD-10-CM

## 2013-08-06 DIAGNOSIS — Z87898 Personal history of other specified conditions: Secondary | ICD-10-CM

## 2013-08-06 DIAGNOSIS — F329 Major depressive disorder, single episode, unspecified: Secondary | ICD-10-CM

## 2013-08-06 HISTORY — DX: Depression, unspecified: F32.A

## 2013-08-06 MED ORDER — DOXYCYCLINE HYCLATE 50 MG PO CAPS: 100.0000 mg | ORAL_CAPSULE | Freq: Two times a day (BID) | ORAL | Status: DC

## 2013-08-06 MED ORDER — ESCITALOPRAM OXALATE 10 MG PO TABS: 10.0000 mg | ORAL_TABLET | Freq: Every day | ORAL | Status: DC

## 2013-08-06 NOTE — Progress Notes (Signed)
Subjective:     Patient ID: Heather Moyer, female   DOB: 09-02-1981, 31 y.o.   MRN: [id number]  HPI Heather Moyer is here for repeat pap, had ASCUS+HPV during pregnancy and normal colpo.Has stopped bleeding with megace, uterus ?tender at times like when sleeps on tummy.She is teary she says about every other day, partner does not help and her job is on hold.  Review of Systems See HPI Reviewed past medical,surgical, social and family history. Reviewed medications and allergies.     Objective:   Physical Exam BP 148/80  Ht 5\' 4"  (1.626 m)  Wt 313 lb (141.976 kg)  BMI 53.7 kg/m2  LMP 06/04/2013   Skin warm and dry.Pelvic: external genitalia is normal in appearance, vagina:dark period like blood, no odor, cervix:smooth,pap performed with HPV, uterus: normal size, shape and contour,  tender, no masses felt, adnexa: no masses or tenderness noted.  Assessment:     Repeat pap after abnormal pap Depression Uterus tender    Plan:     Rx lexapro 10 mg 1 daily #30 with 6 refills Talk to partner to get him to help Rx doxycycline 100 mg 1 bid x 10 days Follow up in 4 weeks

## 2013-08-06 NOTE — Patient Instructions (Signed)
Follow up in 4 weeks Take lexapro

## 2013-09-03 ENCOUNTER — Ambulatory Visit: Payer: Medicaid Other | Admitting: Adult Health

## 2014-04-22 ENCOUNTER — Ambulatory Visit (INDEPENDENT_AMBULATORY_CARE_PROVIDER_SITE_OTHER): Payer: Medicaid Other | Admitting: Women's Health

## 2014-04-22 ENCOUNTER — Encounter: Payer: Self-pay | Admitting: Women's Health

## 2014-04-22 VITALS — BP 120/78 | Ht 64.0 in

## 2014-04-22 DIAGNOSIS — Z3202 Encounter for pregnancy test, result negative: Secondary | ICD-10-CM

## 2014-04-22 DIAGNOSIS — D509 Iron deficiency anemia, unspecified: Secondary | ICD-10-CM

## 2014-04-22 DIAGNOSIS — Z3200 Encounter for pregnancy test, result unknown: Secondary | ICD-10-CM

## 2014-04-22 LAB — POCT URINE PREGNANCY: PREG TEST UR: NEGATIVE

## 2014-04-22 NOTE — Progress Notes (Signed)
Patient ID: Heather Moyer, female   DOB: 03/31/1981, 33 y.o.   MRN: [id number] [facility] and was admitted four days, had blood transfusion. Pt states that while in the hospital than ran some blood tests but they told her to follow up here and see if a gyn issue was causing her to lose blood. Pt has not had a period in 2 months.

## 2014-04-22 NOTE — Progress Notes (Signed)
Patient ID: Heather Moyer, female   DOB: 11/12/1980, 33 y.o.   MRN: [id number]   [facility]  Patient name: Heather Moyer MRN [id number]  Date of birth: 09/28/1981  CC & HPI:  Heather Moyer is a 33 y.o. African American female presenting today for f/u [facility]. Hgb was 7 upon admission, received 2u prbc, came up to 8.8 upon d/c, had SVT w/ occ pauses while on telemetry- they thought may be d/t anemia. She was told to f/u w/ GYN to assess for 'cause of bleeding' per her report. D/C papers read 'acute anemia d/t chronic IDA' and to f/u w/ gyn 2wks. She has a nexplanon that was placed May 2014, had some DUB after placement, was put on megace which controlled this. Has not had any period in 2 months, so has had no need for megace recently. No pelvic pain. She does report constipation, bm's q few days w/ hard stools. Denies diarrhea, melana, or occult blood in stool. Some nausea, vomited x 1 in hospital- no hematemesis. Does report fatigue, lightheadedness- likely d/t anemia. No sob or cp. Taking Fe supplement as directed. States she has been anemic since high school, but has never required transfusion. Does not have PCP.   Pertinent History Reviewed:  Medical & Surgical Hx:   Past Medical History  Diagnosis Date  . Hypertension   . Gestational diabetes   . Ankle fracture, left   . Measles without mention of complication     Measles  . Measles without mention of complication     Measles  . Rubella without mention of complication     Rubella  . Nexplanon insertion 03/19/2013    Inserted 03/19/13 left arm  . DUB (dysfunctional uterine bleeding) 06/04/2013  . Abnormal Pap smear   . Depression 08/06/2013  . Anemia   . Irregular heart rate   . Blood transfusion, without reported diagnosis    Past Surgical History  Procedure Laterality Date  . Cholecystectomy    . Cesarean section    . Cyst removed     Medications: Reviewed & Updated - see associated  section Social History: Reviewed -  reports that she has quit smoking. She has never used smokeless tobacco.  Objective Findings:  Vitals: BP 120/78  Ht 5\' 4"  (1.626 m)  LMP 02/20/2014  Breastfeeding? No  Physical Examination: General appearance - alert, well appearing, and in no distress  Results for orders placed in visit on 04/22/14 (from the past 24 hour(s))  POCT URINE PREGNANCY   Collection Time    04/22/14 10:22 AM      Result Value Ref Range   Preg Test, Ur Negative       Assessment & Plan:  A:   Anemia w/ recent transfusion, sounds more like chronic IDA, not acute blood loss  No GYN cause of anemia P:  Discussed w/ JAG- will refer to [facility]/hematology for further work-up  To continue Fe supplements, gave printed info on Fe-rich diet  Gave printed info on constipation   F/U 10/15 or after for physical, had neg pap 08/06/13   Marge DuncansBooker, Blondell Laperle Randall CNM, [facility]WHNP-BC 04/22/2014 12:32 PM

## 2014-04-22 NOTE — Patient Instructions (Addendum)
Iron-Rich Diet An iron-rich diet contains foods that are good sources of iron. Iron is an important mineral that helps your body produce hemoglobin. Hemoglobin is a protein in red blood cells that carries oxygen to the body's tissues. Sometimes, the iron level in your blood can be low. This may be caused by:  A lack of iron in your diet.  Blood loss.  Times of growth, such as during pregnancy or during a child's growth and development. Low levels of iron can cause a decrease in the number of red blood cells. This can result in iron deficiency anemia. Iron deficiency anemia symptoms include:  Tiredness.  Weakness.  Irritability.  Increased chance of infection. Here are some recommendations for daily iron intake:  Males older than 34 years of age need 8 mg of iron per day.  Women ages 6019 to 7250 need 18 mg of iron per day.  Pregnant women need 27 mg of iron per day, and women who are over 34 years of age and breastfeeding need 9 mg of iron per day.  Women over the age of 750 need 8 mg of iron per day. SOURCES OF IRON There are 2 types of iron that are found in food: heme iron and nonheme iron. Heme iron is absorbed by the body better than nonheme iron. Heme iron is found in meat, poultry, and fish. Nonheme iron is found in grains, beans, and vegetables. Heme Iron Sources Food / Iron (mg)  Chicken liver, 3 oz (85 g)/ 10 mg  Beef liver, 3 oz (85 g)/ 5.5 mg  Oysters, 3 oz (85 g)/ 8 mg  Beef, 3 oz (85 g)/ 2 to 3 mg  Shrimp, 3 oz (85 g)/ 2.8 mg  [place], 3 oz (85 g)/ 2 mg  Chicken, 3 oz (85 g) / 1 mg  Fish (tuna, halibut), 3 oz (85 g)/ 1 mg  Pork, 3 oz (85 g)/ 0.9 mg Nonheme Iron Sources Food / Iron (mg)  Ready-to-eat breakfast cereal, iron-fortified / 3.9 to 7 mg  Tofu,  cup / 3.4 mg  Kidney beans,  cup / 2.6 mg  Baked potato with skin / 2.7 mg  Asparagus,  cup / 2.2 mg  Avocado / 2 mg  Dried peaches,  cup / 1.6 mg  Raisins,  cup / 1.5 mg  Soy milk, 1 cup  / 1.5 mg  Whole-wheat bread, 1 slice / 1.2 mg  Spinach, 1 cup / 0.8 mg  Broccoli,  cup / 0.6 mg IRON ABSORPTION Certain foods can decrease the body's absorption of iron. Try to avoid these foods and beverages while eating meals with iron-containing foods:  Coffee.  Tea.  Fiber.  Soy. Foods containing vitamin C can help increase the amount of iron your body absorbs from iron sources, especially from nonheme sources. Eat foods with vitamin C along with iron-containing foods to increase your iron absorption. Foods that are high in vitamin C include many fruits and vegetables. Some good sources are:  Fresh orange juice.  Oranges.  Strawberries.  Mangoes.  Grapefruit.  Red bell peppers.  Green bell peppers.  Broccoli.  Potatoes with skin.  Tomato juice. Document Released: 05/23/2005 Document Revised: 01/01/2012 Document Reviewed: 03/30/2011 [facility] Patient Information 2015 [employer], [place]. This information is not intended to replace advice given to you by your health care provider. Make sure you discuss any questions you have with your health care provider.  Constipation  Drink plenty of fluid, preferably water, throughout the day  Eat foods high  in fiber such as fruits, vegetables, and grains  Exercise, such as walking, is a good way to keep your bowels regular  Drink warm fluids, especially warm prune juice, or decaf coffee  Eat a 1/2 cup of real oatmeal (not instant), 1/2 cup applesauce, and 1/2-1 cup warm prune juice every day  If needed, you may take Colace (docusate sodium) stool softener once or twice a day to help keep the stool soft. If you are pregnant, wait until you are out of your first trimester (12-14 weeks of pregnancy)  If you still are having problems with constipation, you may take Miralax once daily as needed to help keep your bowels regular.  If you are pregnant, wait until you are out of your first trimester (12-14 weeks of pregnancy)

## 2014-04-27 ENCOUNTER — Encounter: Payer: Self-pay | Admitting: *Deleted

## 2014-04-27 ENCOUNTER — Telehealth: Payer: Self-pay | Admitting: Obstetrics and Gynecology

## 2014-04-27 NOTE — Telephone Encounter (Signed)
Note was written, printed and put up front for the pt to pick up, left message on her machine that it was at front desk for her to pick up.

## 2014-05-08 ENCOUNTER — Encounter (HOSPITAL_COMMUNITY): Payer: Medicaid Other | Attending: Hematology

## 2014-05-08 ENCOUNTER — Encounter (HOSPITAL_COMMUNITY): Payer: Self-pay

## 2014-05-08 VITALS — BP 145/60 | HR 67 | Temp 98.7°F | Resp 18 | Ht 65.25 in | Wt 314.3 lb

## 2014-05-08 DIAGNOSIS — Z87891 Personal history of nicotine dependence: Secondary | ICD-10-CM | POA: Insufficient documentation

## 2014-05-08 DIAGNOSIS — Z6841 Body Mass Index (BMI) 40.0 and over, adult: Secondary | ICD-10-CM | POA: Insufficient documentation

## 2014-05-08 DIAGNOSIS — M79603 Pain in arm, unspecified: Secondary | ICD-10-CM

## 2014-05-08 DIAGNOSIS — M79609 Pain in unspecified limb: Secondary | ICD-10-CM

## 2014-05-08 DIAGNOSIS — D649 Anemia, unspecified: Secondary | ICD-10-CM

## 2014-05-08 DIAGNOSIS — R21 Rash and other nonspecific skin eruption: Secondary | ICD-10-CM

## 2014-05-08 DIAGNOSIS — N920 Excessive and frequent menstruation with regular cycle: Secondary | ICD-10-CM | POA: Insufficient documentation

## 2014-05-08 DIAGNOSIS — D509 Iron deficiency anemia, unspecified: Secondary | ICD-10-CM | POA: Insufficient documentation

## 2014-05-08 NOTE — Patient Instructions (Signed)
[facility] Discharge Instructions  RECOMMENDATIONS MADE BY THE CONSULTANT AND ANY TEST RESULTS WILL BE SENT TO YOUR REFERRING PHYSICIAN.  EXAM FINDINGS BY THE PHYSICIAN TODAY AND SIGNS OR SYMPTOMS TO REPORT TO CLINIC OR PRIMARY PHYSICIAN: Exam and findings as discussed by Dr. Idell PicklesHeller.  Will check some labs today and will give you feraheme infustion next week if your ferritin level is low.    INSTRUCTIONS/FOLLOW-UP: Follow-up in 2 weeks to discuss test results.  Thank you for choosing [facility] to provide your oncology and hematology care.  To afford each patient quality time with our providers, please arrive at least 15 minutes before your scheduled appointment time.  With your help, our goal is to use those 15 minutes to complete the necessary work-up to ensure our physicians have the information they need to help with your evaluation and healthcare recommendations.    Effective January 1st, 2014, we ask that you re-schedule your appointment with our physicians should you arrive 10 or more minutes late for your appointment.  We strive to give you quality time with our providers, and arriving late affects you and other patients whose appointments are after yours.    Again, thank you for choosing [facility].  Our hope is that these requests will decrease the amount of time that you wait before being seen by our physicians.       _____________________________________________________________  Should you have questions after your visit to [facility], please contact our office at [phone] between the hours of 8:30 a.m. and 4:30 p.m.  Voicemails left after 4:30 p.m. will not be returned until the following business day.  For prescription refill requests, have your pharmacy contact our office with your prescription refill request.    _______________________________________________________________  We hope that we have given you  very good care.  You may receive a patient satisfaction survey in the mail, please complete it and return it as soon as possible.  We value your feedback!  _______________________________________________________________  Have you asked about our STAR program?  STAR stands for Survivorship Training and Rehabilitation, and this is a nationally recognized cancer care program that focuses on survivorship and rehabilitation.  Cancer and cancer treatments may cause problems, such as, pain, making you feel tired and keeping you from doing the things that you need or want to do. Cancer rehabilitation can help. Our goal is to reduce these troubling effects and help you have the best quality of life possible.  You may receive a survey from a nurse that asks questions about your current state of health.  Based on the survey results, all eligible patients will be referred to the [facility] program for an evaluation so we can better serve you!  A frequently asked questions sheet is available upon request.

## 2014-05-09 NOTE — Progress Notes (Signed)
[facility]   NEW PATIENT EVALUATION    Name: Heather Moyer Date: 05/08/14 MRN: [id number] DOB: [birth year]  PCP: No PCP Per Patient   REFERRING PHYSICIAN: Shawna Clamp Moyer*   REASON FOR REFERRAL: Microcytic Anemia     HEMATOLOGY/ONCOLOGY HX: The patient is a 33 y.o. African American who has a chronic history of anemia. She consumes starch on a regular basis and also has ice craving.  Heather Moyer presented to the ER at [facility] and was admitted after noting she was symptomatic with dizziness, tachycardia, and dyspnea. Her Hemoglobin was 7.0.  She denied any acute GI, GU, or Gynecologic bleeding. No nausea, vomitting, fevers , chills, jaundice, or chest pain. After being transfused with 2 units of RBCs the Hgb. was 9. Stool for occult blood was negative. A cardiac evaluation for an intermittent arrythmia, including consultation and an echo are reportedly negative in the hospital summary. Heather Moyer symptoms improved and she was discharged on oral iron which she is tolerating satisfactorily.    Heather Moyer has a history of menorrhagia and returned was seen by Heather Clamp at her gynecologist's office. She was placed on megace for her bleeding and has not had a period in 2 months. Her last pelvic exam was in October 2014. A PAP smear was benign. Megace has been discontinue. Intermittent pelvic discomfort since her last C-Secion in May of 2014. She takes oxycodone or advil as needed for pain.         PAST MEDICAL HISTORY:  has a past medical history of Hypertension; Gestational diabetes; Ankle fracture, left; Measles without mention of complication; Measles without mention of complication; Rubella without mention of complication; Nexplanon insertion (03/19/2013); DUB (dysfunctional uterine bleeding) (06/04/2013); Abnormal Pap smear; Depression (08/06/2013); Anemia; Irregular heart rate; and Blood transfusion, without reported diagnosis.     PAST SURGICAL  HISTORY: Past Surgical History  Procedure Laterality Date  . Cholecystectomy    . Cesarean section    . Cyst removed       CURRENT MEDICATIONS: has a current medication list which includes the following prescription(s): etonogestrel, ibuprofen, polysaccharide iron complex, and megestrol.   ALLERGIES: Penicillins   SOCIAL HISTORY:  reports that she has quit smoking. She has never used smokeless tobacco. She reports that she does not drink alcohol or use illicit drugs. She has 2 children and is living with her boyfriend. 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   FAMILY HISTORY: Paternal Aunt had stomach cancer (? Gastric) and a paternal uncle with throat cancer. Positive family history for ASHD.     REVIEW OF SYSTEMS:  HEENT: Migraine headaches. 1 episode associated with transient right sided upper extremity weakness. Pulmonary: no cough, chest pain, wheezing. Cardiac: as above. GI: No abdominal pain or change in bowel habits. GU: negative. Gyn: noted above. Musculoskeletal: recent onset of shoulder joint pain with decreased abduction that is improving. She denies fall or recent trauma although has her work is physically strenuous. No history of arthritis. Left ankle fracture in distant past. Negative history for DVT or claudication. Dermatologic: chronic rash on back.    Other than that discussed above is noncontributory.  PHYSICAL EXAM:  height is 5' 5.25" (1.657 m) and weight is 314 lb 4.8 oz (142.566 kg). Her oral temperature is 98.7 F (37.1 C). Her blood pressure is 145/60 and her pulse is 67. Her respiration is 18 and oxygen saturation is 99%.    GENERAL:alert, no distress and comfortable. She is obese.  SKIN: skin color, texture, turgor are normal. Linear area of hyperpigmentation across the lower thoracic spine extending to the lateral chest wall.bilaterally.    EYES: normal, Conjunctiva are pink and non-injected, sclera clear OROPHARYNX:no exudate, no erythema and lips, buccal  mucosa, and tongue normal  NECK: supple, thyroid normal size, non-tender, without nodularity. Carotids unremarkable bilaterally.  CHEST: Thorax without localized tenderness No masses. Skin as noted above   LYMPH:  no palpable lymphadenopathy in the cervical, axillary or inguinal LUNGS: clear to auscultation and percussion with normal breathing effort HEART: regular rate & rhythm and no murmurs ABDOMEN:abdomen soft, non-tender and normal bowel sounds, liver and spleen not palpable but exam is technically limited.  MUSCULOSKELETAL: no cyanosis of digits, no clubbing or edema. Decreased ROM upper extremeties associated with shoulder joint pain.  NEURO: alert & oriented x 3 with fluent speech, no focal motor/sensory deficits; reflexes symmetric but decreased in upper extremities due to obesity   SPINE: No localized tenderness except for:  RIB CAGE: No localized tenderness except for:    LABORATORY DATA:  Office Visit on 04/22/2014  Component Date Value Ref Range Status  . Preg Test, Ur 04/22/2014 Negative   Final    CBC one year ago  With Hgb. 10.2, Hct 32.9, and MCV 64.9. WBC and platelet count were normal. Pregnancy test negative on 04/22/14.  PATHOLOGY: See prior pap smear results from October.   RADIOGRAPHY: [facility]. Normal brain MRI 04/19/06.  Otherwise non contributory.    IMPRESSION:   1. Ms. Heather RacerustinIs a premenopausal  Woman with history consistent with an iron deficiency anemia. She has been restarted on oral iron.   Chronic starch consumption is associated with decreased iron absorption. She also has a prior history of heavy periods and DUB. 2. Bilateral upper extremity pain which appears to be soft tissue in origin 3. Focal hyperpigmentation 4. Obesity    RECOMMENDATIONS:  1. CBC with differential, reticulocyte count, ferritin level, C-reactive protein, and ANA. 2. Trial of Parental iron in light of dietary habits and intermittent bleeding with heavy  periods. 3. Establish with a PCP  4. Followup with Gynecology for complaints of pelvic pain and any further bleeding problems.       Thank you for referring this patient for consultation. Imelda PillowHELLER,Clarice Bonaventure S, MD 05/09/2014 11:38 AM  .

## 2014-05-11 ENCOUNTER — Other Ambulatory Visit (HOSPITAL_COMMUNITY): Payer: Self-pay | Admitting: Hematology

## 2014-05-11 ENCOUNTER — Encounter (HOSPITAL_BASED_OUTPATIENT_CLINIC_OR_DEPARTMENT_OTHER): Payer: Medicaid Other

## 2014-05-11 DIAGNOSIS — D509 Iron deficiency anemia, unspecified: Secondary | ICD-10-CM | POA: Diagnosis present

## 2014-05-11 DIAGNOSIS — D649 Anemia, unspecified: Secondary | ICD-10-CM

## 2014-05-11 DIAGNOSIS — Z6841 Body Mass Index (BMI) 40.0 and over, adult: Secondary | ICD-10-CM | POA: Diagnosis not present

## 2014-05-11 DIAGNOSIS — N920 Excessive and frequent menstruation with regular cycle: Secondary | ICD-10-CM | POA: Diagnosis not present

## 2014-05-11 DIAGNOSIS — Z87891 Personal history of nicotine dependence: Secondary | ICD-10-CM | POA: Diagnosis not present

## 2014-05-11 LAB — C-REACTIVE PROTEIN: CRP: 3.4 mg/dL — AB (ref <0.60)

## 2014-05-11 LAB — CBC WITH DIFFERENTIAL/PLATELET
Basophils Absolute: 0 10*3/uL (ref 0.0–0.1)
Basophils Relative: 0 % (ref 0–1)
EOS PCT: 3 % (ref 0–5)
Eosinophils Absolute: 0.2 10*3/uL (ref 0.0–0.7)
HEMATOCRIT: 34.4 % — AB (ref 36.0–46.0)
HEMOGLOBIN: 10.1 g/dL — AB (ref 12.0–15.0)
LYMPHS PCT: 22 % (ref 12–46)
Lymphs Abs: 1.6 10*3/uL (ref 0.7–4.0)
MCH: 19.8 pg — ABNORMAL LOW (ref 26.0–34.0)
MCHC: 29.4 g/dL — ABNORMAL LOW (ref 30.0–36.0)
MCV: 67.3 fL — AB (ref 78.0–100.0)
MONO ABS: 0.3 10*3/uL (ref 0.1–1.0)
MONOS PCT: 4 % (ref 3–12)
Neutro Abs: 5.3 10*3/uL (ref 1.7–7.7)
Neutrophils Relative %: 71 % (ref 43–77)
Platelets: 347 10*3/uL (ref 150–400)
RBC: 5.11 MIL/uL (ref 3.87–5.11)
RDW: 27.9 % — ABNORMAL HIGH (ref 11.5–15.5)
WBC: 7.5 10*3/uL (ref 4.0–10.5)

## 2014-05-11 LAB — RETICULOCYTES
RBC.: 5.11 MIL/uL (ref 3.87–5.11)
RETIC COUNT ABSOLUTE: 56.2 10*3/uL (ref 19.0–186.0)
Retic Ct Pct: 1.1 % (ref 0.4–3.1)

## 2014-05-11 LAB — FERRITIN: Ferritin: 62 ng/mL (ref 10–291)

## 2014-05-11 NOTE — Progress Notes (Signed)
LABS DRAWN FOR CBC,RETIC,FERR,CRPP

## 2014-05-22 ENCOUNTER — Encounter (HOSPITAL_COMMUNITY): Payer: Self-pay

## 2014-05-22 ENCOUNTER — Encounter (HOSPITAL_BASED_OUTPATIENT_CLINIC_OR_DEPARTMENT_OTHER): Payer: Medicaid Other

## 2014-05-22 VITALS — BP 145/75 | HR 83 | Temp 97.5°F | Resp 20 | Wt 314.7 lb

## 2014-05-22 DIAGNOSIS — K909 Intestinal malabsorption, unspecified: Secondary | ICD-10-CM

## 2014-05-22 DIAGNOSIS — N920 Excessive and frequent menstruation with regular cycle: Secondary | ICD-10-CM

## 2014-05-22 DIAGNOSIS — D500 Iron deficiency anemia secondary to blood loss (chronic): Secondary | ICD-10-CM

## 2014-05-22 DIAGNOSIS — D649 Anemia, unspecified: Secondary | ICD-10-CM

## 2014-05-22 DIAGNOSIS — K9089 Other intestinal malabsorption: Secondary | ICD-10-CM

## 2014-05-22 NOTE — Patient Instructions (Addendum)
[facility] Discharge Instructions  RECOMMENDATIONS MADE BY THE CONSULTANT AND ANY TEST RESULTS WILL BE SENT TO YOUR REFERRING PHYSICIAN.  EXAM FINDINGS BY THE PHYSICIAN TODAY AND SIGNS OR SYMPTOMS TO REPORT TO CLINIC OR PRIMARY PHYSICIAN:   You can stop taking your Iron Pill  We are going to give you Feraheme 510mg  on 8/4 and then 510mg  on 8/11  Return to see Dr. Zigmund DanielFormanek in 6 weeks and have labs done the same day July 03, 2014 @ 8:50   Thank you for choosing [facility] to provide your oncology and hematology care.  To afford each patient quality time with our providers, please arrive at least 15 minutes before your scheduled appointment time.  With your help, our goal is to use those 15 minutes to complete the necessary work-up to ensure our physicians have the information they need to help with your evaluation and healthcare recommendations.    Effective January 1st, 2014, we ask that you re-schedule your appointment with our physicians should you arrive 10 or more minutes late for your appointment.  We strive to give you quality time with our providers, and arriving late affects you and other patients whose appointments are after yours.    Again, thank you for choosing [facility].  Our hope is that these requests will decrease the amount of time that you wait before being seen by our physicians.       _____________________________________________________________  Should you have questions after your visit to [facility], please contact our office at [phone] between the hours of 8:30 a.m. and 5:00 p.m.  Voicemails left after 4:30 p.m. will not be returned until the following business day.  For prescription refill requests, have your pharmacy contact our office with your prescription refill request.    Ferumoxytol injection What is this medicine? FERUMOXYTOL is an iron complex. Iron is used to make healthy red blood  cells, which carry oxygen and nutrients throughout the body. This medicine is used to treat iron deficiency anemia in people with chronic kidney disease. This medicine may be used for other purposes; ask your health care provider or pharmacist if you have questions. COMMON BRAND NAME(S): Feraheme What should I tell my health care provider before I take this medicine? They need to know if you have any of these conditions: -anemia not caused by low iron levels -high levels of iron in the blood -magnetic resonance imaging (MRI) test scheduled -an unusual or allergic reaction to iron, other medicines, foods, dyes, or preservatives -pregnant or trying to get pregnant -breast-feeding How should I use this medicine? This medicine is for injection into a vein. It is given by a health care professional in a hospital or clinic setting. Talk to your pediatrician regarding the use of this medicine in children. Special care may be needed. Overdosage: If you think you've taken too much of this medicine contact a poison control center or emergency room at once. Overdosage: If you think you have taken too much of this medicine contact a poison control center or emergency room at once. NOTE: This medicine is only for you. Do not share this medicine with others. What if I miss a dose? It is important not to miss your dose. Call your doctor or health care professional if you are unable to keep an appointment. What may interact with this medicine? This medicine may interact with the following medications: -other iron products This list may not describe  all possible interactions. Give your health care provider a list of all the medicines, herbs, non-prescription drugs, or dietary supplements you use. Also tell them if you smoke, drink alcohol, or use illegal drugs. Some items may interact with your medicine. What should I watch for while using this medicine? Visit your doctor or healthcare professional regularly.  Tell your doctor or healthcare professional if your symptoms do not start to get better or if they get worse. You may need blood work done while you are taking this medicine. You may need to follow a special diet. Talk to your doctor. Foods that contain iron include: whole grains/cereals, dried fruits, beans, or peas, leafy green vegetables, and organ meats (liver, kidney). What side effects may I notice from receiving this medicine? Side effects that you should report to your doctor or health care professional as soon as possible: -allergic reactions like skin rash, itching or hives, swelling of the face, lips, or tongue -breathing problems -changes in blood pressure -feeling faint or lightheaded, falls -fever or chills -flushing, sweating, or hot feelings -swelling of the ankles or feet Side effects that usually do not require medical attention (Report these to your doctor or health care professional if they continue or are bothersome.): -diarrhea -headache -nausea, vomiting -stomach pain This list may not describe all possible side effects. Call your doctor for medical advice about side effects. You may report side effects to FDA at 1-800-FDA-1088. Where should I keep my medicine? This drug is given in a hospital or clinic and will not be stored at home. NOTE: This sheet is a summary. It may not cover all possible information. If you have questions about this medicine, talk to your doctor, pharmacist, or health care provider.  2015, Elsevier/Gold Standard. (2012-05-24 15:23:36)

## 2014-05-22 NOTE — Progress Notes (Signed)
[facility]  OFFICE PROGRESS NOTE  No PCP Per Patient No address on file  DIAGNOSIS: Anemia, unspecified  Anemia due to chronic blood loss, menstrual.  Chief Complaint  Patient presents with  . 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  INTERVAL HISTORY: Heather Moyer 33 y.o. female returns for followup of iron deficiency with chronic blood loss secondary to menstrual periods, taking oral iron daily with last hemoglobin on 05/11/2014 at 10.1.  She did require red blood cell transfusion recently when hemoglobin was 7 with stool for occult blood be negative. She was placed on Megace by her gynecologist and menses stopped for 2 months. She is also had a Etonogestrel implant placed about 2 years ago. She continues each starch. She is taking iron supplements. She denies any melena, hematochezia, or any menstrual bleeding since 3 months ago. Appetite is good with no nausea, vomiting, but still with craving for ice chips. She denies any incontinence, lower 70 swelling or redness, and is about to embark on a new career.   MEDICAL HISTORY: Past Medical History  Diagnosis Date  . Hypertension   . Gestational diabetes   . Ankle fracture, left   . Measles without mention of complication     Measles  . Measles without mention of complication     Measles  . Rubella without mention of complication     Rubella  . Nexplanon insertion 03/19/2013    Inserted 03/19/13 left arm  . DUB (dysfunctional uterine bleeding) 06/04/2013  . Abnormal Pap smear   . Depression 08/06/2013  . Anemia   . Irregular heart rate   . Blood transfusion, without reported diagnosis     INTERIM HISTORY: has HYPERLIPIDEMIA; HYPOCALCEMIA; OBESITY NOS; MICROCYTIC ANEMIA; DEPRESSION/ANXIETY; MIGRAINE HEADACHE; HYPERTENSION; OVARIAN CYST, RIGHT; GESTATIONAL DIABETES; SKIN RASH; History of cesarean section, low transverse; Nexplanon  insertion; Abnormal Pap smear, atypical squamous cells of undetermined sign (ASC-US); DUB (dysfunctional uterine bleeding); and Depression on her problem list.    ALLERGIES:  is allergic to penicillins.  MEDICATIONS: has a current medication list which includes the following prescription(s): etonogestrel, ibuprofen, megestrol, and polysaccharide iron complex.  SURGICAL HISTORY:  Past Surgical History  Procedure Laterality Date  . Cholecystectomy    . Cesarean section    . Cyst removed      FAMILY HISTORY: family history includes Cancer in her other, paternal aunt, and paternal grandfather; Heart disease in her father; Hypertension in her father and mother.  SOCIAL HISTORY:  reports that she has quit smoking. She has never used smokeless tobacco. She reports that she does not drink alcohol or use illicit drugs.  REVIEW OF SYSTEMS:  Other than that discussed above is noncontributory.  PHYSICAL EXAMINATION: ECOG PERFORMANCE STATUS: 1 - Symptomatic but completely ambulatory  Blood pressure 145/75, pulse 83, temperature 97.5 F (36.4 C), temperature source Oral, resp. rate 20, weight 314 lb 11.2 oz (142.747 kg), last menstrual period 02/20/2014, not currently breastfeeding.  GENERAL:alert, no distress and comfortable. Morbidly obese. SKIN: skin color, texture, turgor are normal, no rashes or significant lesions EYES: PERLA; Conjunctiva are pink and non-injected, sclera clear SINUSES: No redness or tenderness over maxillary or ethmoid sinuses OROPHARYNX:no exudate, no erythema on lips, buccal mucosa, or tongue. NECK: supple, thyroid normal size, non-tender, without nodularity. No masses CHEST: Normal AP diameter with no breast masses. LYMPH:  no palpable lymphadenopathy in the cervical, axillary  or inguinal LUNGS: clear to auscultation and percussion with normal breathing effort HEART: regular rate & rhythm and no murmurs. ABDOMEN:abdomen soft, non-tender and normal bowel  sounds MUSCULOSKELETAL:no cyanosis of digits and no clubbing. Range of motion normal.  NEURO: alert & oriented x 3 with fluent speech, no focal motor/sensory deficits   LABORATORY DATA: Lab on 05/11/2014  Component Date Value Ref Range Status  . WBC 05/11/2014 7.5  4.0 - 10.5 K/uL Final  . RBC 05/11/2014 5.11  3.87 - 5.11 MIL/uL Final  . Hemoglobin 05/11/2014 10.1* 12.0 - 15.0 g/dL Final  . HCT 16/10/960407/20/2015 34.4* 36.0 - 46.0 % Final  . MCV 05/11/2014 67.3* 78.0 - 100.0 fL Final  . MCH 05/11/2014 19.8* 26.0 - 34.0 pg Final  . MCHC 05/11/2014 29.4* 30.0 - 36.0 g/dL Final  . RDW 54/09/811907/20/2015 27.9* 11.5 - 15.5 % Final  . Platelets 05/11/2014 347  150 - 400 K/uL Final   Comment: SPECIMEN CHECKED FOR CLOTS                          PLATELET COUNT CONFIRMED BY SMEAR  . Neutrophils Relative % 05/11/2014 71  43 - 77 % Final  . Neutro Abs 05/11/2014 5.3  1.7 - 7.7 K/uL Final  . Lymphocytes Relative 05/11/2014 22  12 - 46 % Final  . Lymphs Abs 05/11/2014 1.6  0.7 - 4.0 K/uL Final  . Monocytes Relative 05/11/2014 4  3 - 12 % Final  . Monocytes Absolute 05/11/2014 0.3  0.1 - 1.0 K/uL Final  . Eosinophils Relative 05/11/2014 3  0 - 5 % Final  . Eosinophils Absolute 05/11/2014 0.2  0.0 - 0.7 K/uL Final  . Basophils Relative 05/11/2014 0  0 - 1 % Final  . Basophils Absolute 05/11/2014 0.0  0.0 - 0.1 K/uL Final  . Retic Ct Pct 05/11/2014 1.1  0.4 - 3.1 % Final  . RBC. 05/11/2014 5.11  3.87 - 5.11 MIL/uL Final  . Retic Count, Manual 05/11/2014 56.2  19.0 - 186.0 K/uL Final  . Ferritin 05/11/2014 62  10 - 291 ng/mL Final   Performed at [facility]  . CRP 05/11/2014 3.4* <0.60 mg/dL Final   Performed at [facility]    PATHOLOGY: No pathology.  Urinalysis    Component Value Date/Time   COLORURINE YELLOW 06/27/2012 2107   APPEARANCEUR CLEAR 06/27/2012 2107   LABSPEC 1.025 06/27/2012 2107   PHURINE 6.5 06/27/2012 2107   GLUCOSEU NEGATIVE 06/27/2012 2107   HGBUR LARGE* 06/27/2012 2107    BILIRUBINUR NEGATIVE 06/27/2012 2107   KETONESUR NEGATIVE 06/27/2012 2107   PROTEINUR neg 01/10/2013 1042   PROTEINUR NEGATIVE 06/27/2012 2107   UROBILINOGEN 1.0 06/27/2012 2107   NITRITE neg 01/10/2013 1042   NITRITE NEGATIVE 06/27/2012 2107   LEUKOCYTESUR Negative 01/10/2013 1042    RADIOGRAPHIC STUDIES: No results found.  ASSESSMENT:  #1. 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 #2. Morbid obesity. #3. Possible thalassemia trait.    PLAN:  #1. IV Feraheme on 05/26/2014 and 06/02/2013. #2. Followup in 6 weeks with CBC, ferritin, and hemoglobin electrophoresis.   All questions were answered. The patient knows to call the clinic with any problems, questions or concerns. We can certainly see the patient much sooner if necessary.   I spent 25 minutes counseling the patient face to face. The total time spent in the appointment was 30 minutes.    Maurilio LovelyFormanek, Doll Frazee A, MD 05/22/2014  1:44 PM  DISCLAIMER:  This note was dictated with voice recognition software.  Similar sounding words can inadvertently be transcribed inaccurately and may not be corrected upon review.

## 2014-05-25 ENCOUNTER — Other Ambulatory Visit (HOSPITAL_COMMUNITY): Payer: Self-pay | Admitting: Hematology and Oncology

## 2014-05-25 NOTE — Addendum Note (Signed)
Addended by: Oda KiltsBRAY, Adair Lemar S on: 05/25/2014 12:19 PM   Modules accepted: Orders

## 2014-05-26 ENCOUNTER — Ambulatory Visit (HOSPITAL_COMMUNITY): Payer: BC Managed Care – PPO

## 2014-05-26 ENCOUNTER — Encounter (HOSPITAL_COMMUNITY): Payer: BC Managed Care – PPO | Attending: Hematology and Oncology

## 2014-05-26 ENCOUNTER — Encounter (HOSPITAL_BASED_OUTPATIENT_CLINIC_OR_DEPARTMENT_OTHER): Payer: BC Managed Care – PPO | Admitting: Oncology

## 2014-05-26 VITALS — BP 154/79 | HR 68 | Temp 97.3°F | Resp 18

## 2014-05-26 DIAGNOSIS — D539 Nutritional anemia, unspecified: Secondary | ICD-10-CM | POA: Insufficient documentation

## 2014-05-26 DIAGNOSIS — D500 Iron deficiency anemia secondary to blood loss (chronic): Secondary | ICD-10-CM

## 2014-05-26 DIAGNOSIS — T782XXA Anaphylactic shock, unspecified, initial encounter: Secondary | ICD-10-CM

## 2014-05-26 MED ORDER — SODIUM CHLORIDE 0.9 % IV SOLN
510.0000 mg | Freq: Once | INTRAVENOUS | Status: AC
  Administered 2014-05-26: 510 mg via INTRAVENOUS
  Filled 2014-05-26: qty 17

## 2014-05-26 MED ORDER — SODIUM CHLORIDE 0.9 % IJ SOLN: 10.0000 mL | INTRAMUSCULAR | Status: DC | PRN

## 2014-05-26 MED ORDER — METHYLPREDNISOLONE SODIUM SUCC 125 MG IJ SOLR
INTRAMUSCULAR | Status: AC
  Filled 2014-05-26: qty 2

## 2014-05-26 MED ORDER — METHYLPREDNISOLONE SODIUM SUCC 125 MG IJ SOLR
125.0000 mg | Freq: Once | INTRAMUSCULAR | Status: AC
  Administered 2014-05-26: 125 mg via INTRAVENOUS

## 2014-05-26 MED ORDER — FERRIC CARBOXYMALTOSE 750 MG/15ML IV SOLN
INTRAVENOUS | Status: DC
  Administered 2014-06-02: 11:00:00 via INTRAVENOUS
  Filled 2014-05-26: qty 15

## 2014-05-26 MED ORDER — DIPHENHYDRAMINE HCL 50 MG/ML IJ SOLN
50.0000 mg | Freq: Once | INTRAMUSCULAR | Status: AC
  Administered 2014-05-26: 50 mg via INTRAVENOUS

## 2014-05-26 MED ORDER — DIPHENHYDRAMINE HCL 50 MG/ML IJ SOLN
INTRAMUSCULAR | Status: AC
  Filled 2014-05-26: qty 1

## 2014-05-26 MED ORDER — FERRIC CARBOXYMALTOSE 750 MG/15ML IV SOLN: 750.0000 mg | INTRAVENOUS | Status: DC

## 2014-05-26 MED ORDER — ACETAMINOPHEN 325 MG PO TABS
650.0000 mg | ORAL_TABLET | Freq: Once | ORAL | Status: AC
  Administered 2014-05-26: 650 mg via ORAL

## 2014-05-26 MED ORDER — ACETAMINOPHEN 325 MG PO TABS
ORAL_TABLET | ORAL | Status: AC
  Filled 2014-05-26: qty 2

## 2014-05-26 MED ORDER — SODIUM CHLORIDE 0.9 % IV SOLN
Freq: Once | INTRAVENOUS | Status: AC
  Administered 2014-05-26: 11:00:00 via INTRAVENOUS

## 2014-05-26 NOTE — Progress Notes (Signed)
The patient is seen emergently after receiving a few cc's of Feraheme.  I was asked to excuse myself from seeing a patient to respond to an urgent situation.    Heather Moyer is receiving Feraheme for the first time.  The infusion was stopped prior to me entering her treatment room.  She reported sudden onset of SOB with dyspnea.  She also noted chest pain.  As a result, the anaphylactic protocol was initiated.  Vital were performed while the nurse gather materials per protocol.  Vitals were noted with an elevated BP of ~185/115 with a pulse of 89 or so.  O2 Sats were adequate at 98%.  Orders were given to nurse for medications.  NS was started at 500 cc/hr.  650 mg of Tylenol, 50 mg of IV benadryl, and 125 mg of solumedrol were given immediately.    Within 1-2 minutes, her BP improved.  Chest pain resolved.  Pepcid was held for the time being given improvements in vital and clinical situation.  She was held in the clinic for 45 minutes with NS and she recovered completely.  Patient education was given on anaphylaxis.  She is educated that she is not allergic to iron, but the component attached to it. She is encouraged to repeat Benadryl at home every 6 hours with any symptoms associated with allergic reaction.  Nursing education was provided.   She is to report to ED with any complaints.  We will try Injectafer next week.  Orders placed and signed and held.  Patient and plan discussed with Dr. Alla GermanGregory Formanek and he is in agreement with the aforementioned.   More than 50% of the time spent with the patient was utilized for counseling and coordination of care.  Heather Moyer 05/26/2014

## 2014-05-26 NOTE — Progress Notes (Unsigned)
Feraheme was infusing, pt called out to staff and stated she was not feeling well, having difficulty breathing.infusion stopped.  Pa Kefalas to bedside.  Solumedrol, benadryl, tylenol and normal saline bolus given, pt was alert throughout and able to answer all questions.

## 2014-05-26 NOTE — Patient Instructions (Signed)
Ferumoxytol injection What is this medicine? FERUMOXYTOL is an iron complex. Iron is used to make healthy red blood cells, which carry oxygen and nutrients throughout the body. This medicine is used to treat iron deficiency anemia in people with chronic kidney disease. This medicine may be used for other purposes; ask your health care provider or pharmacist if you have questions. COMMON BRAND NAME(S): Feraheme What should I tell my health care provider before I take this medicine? They need to know if you have any of these conditions: -anemia not caused by low iron levels -high levels of iron in the blood -magnetic resonance imaging (MRI) test scheduled -an unusual or allergic reaction to iron, other medicines, foods, dyes, or preservatives -pregnant or trying to get pregnant -breast-feeding How should I use this medicine? This medicine is for injection into a vein. It is given by a health care professional in a hospital or clinic setting. Talk to your pediatrician regarding the use of this medicine in children. Special care may be needed. Overdosage: If you think you've taken too much of this medicine contact a poison control center or emergency room at once. Overdosage: If you think you have taken too much of this medicine contact a poison control center or emergency room at once. NOTE: This medicine is only for you. Do not share this medicine with others. What if I miss a dose? It is important not to miss your dose. Call your doctor or health care professional if you are unable to keep an appointment. What may interact with this medicine? This medicine may interact with the following medications: -other iron products This list may not describe all possible interactions. Give your health care provider a list of all the medicines, herbs, non-prescription drugs, or dietary supplements you use. Also tell them if you smoke, drink alcohol, or use illegal drugs. Some items may interact with your  medicine. What should I watch for while using this medicine? Visit your doctor or healthcare professional regularly. Tell your doctor or healthcare professional if your symptoms do not start to get better or if they get worse. You may need blood work done while you are taking this medicine. You may need to follow a special diet. Talk to your doctor. Foods that contain iron include: whole grains/cereals, dried fruits, beans, or peas, leafy green vegetables, and organ meats (liver, kidney). What side effects may I notice from receiving this medicine? Side effects that you should report to your doctor or health care professional as soon as possible: -allergic reactions like skin rash, itching or hives, swelling of the face, lips, or tongue -breathing problems -changes in blood pressure -feeling faint or lightheaded, falls -fever or chills -flushing, sweating, or hot feelings -swelling of the ankles or feet Side effects that usually do not require medical attention (Report these to your doctor or health care professional if they continue or are bothersome.): -diarrhea -headache -nausea, vomiting -stomach pain This list may not describe all possible side effects. Call your doctor for medical advice about side effects. You may report side effects to FDA at 1-800-FDA-1088. YOU may take benadryl that is over the counter 25-50 mg for the next 24 hours if needed.  Please go to the Emergency Department if rash, shortness of breath or back back returns.  NOTE: This sheet is a summary. It may not cover all possible information. If you have questions about this medicine, talk to your doctor, pharmacist, or health care provider.  2015, Elsevier/Gold Standard. (2012-05-24 15:23:36)

## 2014-06-02 ENCOUNTER — Encounter (HOSPITAL_BASED_OUTPATIENT_CLINIC_OR_DEPARTMENT_OTHER): Payer: BC Managed Care – PPO

## 2014-06-02 DIAGNOSIS — K9089 Other intestinal malabsorption: Secondary | ICD-10-CM

## 2014-06-02 DIAGNOSIS — D500 Iron deficiency anemia secondary to blood loss (chronic): Secondary | ICD-10-CM

## 2014-06-02 NOTE — Progress Notes (Signed)
1145:  Tolerated well with no s/s of infusion reaction.

## 2014-06-09 ENCOUNTER — Encounter (HOSPITAL_BASED_OUTPATIENT_CLINIC_OR_DEPARTMENT_OTHER): Payer: BC Managed Care – PPO

## 2014-06-09 VITALS — BP 125/77 | HR 82 | Temp 98.0°F | Resp 20

## 2014-06-09 DIAGNOSIS — D539 Nutritional anemia, unspecified: Secondary | ICD-10-CM

## 2014-06-09 DIAGNOSIS — D509 Iron deficiency anemia, unspecified: Secondary | ICD-10-CM

## 2014-06-09 MED ORDER — FERRIC CARBOXYMALTOSE 750 MG/15ML IV SOLN: 750.0000 mg | Freq: Once | INTRAVENOUS | Status: DC

## 2014-06-09 MED ORDER — SODIUM CHLORIDE 0.9 % IJ SOLN
10.0000 mL | INTRAMUSCULAR | Status: DC | PRN
  Administered 2014-06-09: 10 mL via INTRAVENOUS

## 2014-06-09 MED ORDER — SODIUM CHLORIDE 0.9 % IV SOLN
INTRAVENOUS | Status: DC
  Administered 2014-06-09: 11:00:00 via INTRAVENOUS

## 2014-06-09 MED ORDER — SODIUM CHLORIDE 0.9 % IV SOLN
Freq: Once | INTRAVENOUS | Status: AC
  Administered 2014-06-09: 11:00:00 via INTRAVENOUS
  Filled 2014-06-09: qty 15

## 2014-06-09 NOTE — Progress Notes (Signed)
Tolerated infusion well. Pt to be discharged home @ 12

## 2014-07-03 ENCOUNTER — Other Ambulatory Visit (HOSPITAL_COMMUNITY): Payer: BC Managed Care – PPO

## 2014-07-03 ENCOUNTER — Encounter (HOSPITAL_BASED_OUTPATIENT_CLINIC_OR_DEPARTMENT_OTHER): Payer: BC Managed Care – PPO

## 2014-07-03 ENCOUNTER — Encounter (HOSPITAL_COMMUNITY): Payer: Self-pay

## 2014-07-03 ENCOUNTER — Encounter (HOSPITAL_COMMUNITY): Payer: BC Managed Care – PPO | Attending: Hematology and Oncology

## 2014-07-03 ENCOUNTER — Ambulatory Visit (HOSPITAL_COMMUNITY): Payer: BC Managed Care – PPO

## 2014-07-03 VITALS — BP 162/89 | HR 81 | Temp 97.8°F | Resp 18 | Wt 316.6 lb

## 2014-07-03 DIAGNOSIS — D500 Iron deficiency anemia secondary to blood loss (chronic): Secondary | ICD-10-CM

## 2014-07-03 DIAGNOSIS — K909 Intestinal malabsorption, unspecified: Secondary | ICD-10-CM

## 2014-07-03 DIAGNOSIS — D539 Nutritional anemia, unspecified: Secondary | ICD-10-CM | POA: Insufficient documentation

## 2014-07-03 DIAGNOSIS — R5383 Other fatigue: Secondary | ICD-10-CM

## 2014-07-03 DIAGNOSIS — D649 Anemia, unspecified: Secondary | ICD-10-CM

## 2014-07-03 DIAGNOSIS — N920 Excessive and frequent menstruation with regular cycle: Secondary | ICD-10-CM

## 2014-07-03 DIAGNOSIS — G479 Sleep disorder, unspecified: Secondary | ICD-10-CM

## 2014-07-03 LAB — CBC WITH DIFFERENTIAL/PLATELET
Basophils Absolute: 0 10*3/uL (ref 0.0–0.1)
Basophils Relative: 0 % (ref 0–1)
Eosinophils Absolute: 0.2 10*3/uL (ref 0.0–0.7)
Eosinophils Relative: 3 % (ref 0–5)
HCT: 41 % (ref 36.0–46.0)
HEMOGLOBIN: 12.8 g/dL (ref 12.0–15.0)
LYMPHS ABS: 1.8 10*3/uL (ref 0.7–4.0)
LYMPHS PCT: 30 % (ref 12–46)
MCH: 23.1 pg — ABNORMAL LOW (ref 26.0–34.0)
MCHC: 31.2 g/dL (ref 30.0–36.0)
MCV: 74.1 fL — ABNORMAL LOW (ref 78.0–100.0)
MONO ABS: 0.3 10*3/uL (ref 0.1–1.0)
MONOS PCT: 4 % (ref 3–12)
NEUTROS ABS: 3.9 10*3/uL (ref 1.7–7.7)
NEUTROS PCT: 63 % (ref 43–77)
Platelets: 274 10*3/uL (ref 150–400)
RBC: 5.53 MIL/uL — AB (ref 3.87–5.11)
RDW: 22.6 % — ABNORMAL HIGH (ref 11.5–15.5)
WBC: 6.2 10*3/uL (ref 4.0–10.5)

## 2014-07-03 LAB — FERRITIN: Ferritin: 497 ng/mL — ABNORMAL HIGH (ref 10–291)

## 2014-07-03 NOTE — Progress Notes (Signed)
LABS FOR CBCD,FERR,HEPG

## 2014-07-03 NOTE — Patient Instructions (Signed)
[facility] Discharge Instructions  RECOMMENDATIONS MADE BY THE CONSULTANT AND ANY TEST RESULTS WILL BE SENT TO YOUR REFERRING PHYSICIAN.  EXAM FINDINGS BY THE PHYSICIAN TODAY AND SIGNS OR SYMPTOMS TO REPORT TO CLINIC OR PRIMARY PHYSICIAN: You saw Dr Zigmund Daniel today  We are going to schedule sleep studies for you.  Follow up in 3 months with doctor and lab work.    Thank you for choosing [facility] to provide your oncology and hematology care.  To afford each patient quality time with our providers, please arrive at least 15 minutes before your scheduled appointment time.  With your help, our goal is to use those 15 minutes to complete the necessary work-up to ensure our physicians have the information they need to help with your evaluation and healthcare recommendations.    Effective January 1st, 2014, we ask that you re-schedule your appointment with our physicians should you arrive 10 or more minutes late for your appointment.  We strive to give you quality time with our providers, and arriving late affects you and other patients whose appointments are after yours.    Again, thank you for choosing [facility].  Our hope is that these requests will decrease the amount of time that you wait before being seen by our physicians.       _____________________________________________________________  Should you have questions after your visit to [facility], please contact our office at [phone] between the hours of 8:30 a.m. and 5:00 p.m.  Voicemails left after 4:30 p.m. will not be returned until the following business day.  For prescription refill requests, have your pharmacy contact our office with your prescription refill request.

## 2014-07-03 NOTE — Progress Notes (Signed)
[facility]  OFFICE PROGRESS NOTE  No PCP Per Patient No address on file  DIAGNOSIS: Iron malabsorption - Plan: CBC with Differential, Ferritin  Fatigue due to sleep pattern disturbance - Plan: Polysomnography 4 or more parameters  Chief Complaint  Patient presents with  . Iron deficiency    Ferric Carboxymaltose x2    CURRENT THERAPY: Ferric carboxymaltose 700 mg on 06/02/2014 and 06/09/2014. She had developed an allergic reaction to [facility] when attempted to be administered on 05/26/2014.  INTERVAL HISTORY: Heather Moyer 33 y.o. female returns for after ferric Carboxymaltose infusion for iron deficiency anemia which we've been rendered on 06/02/2014 and 06/09/2014.  She does feel more energetic for about a week after the iron infusion but again has been having problems staying asleep and at the same time as the common more fatigued. She tolerated alternative iron infusion well with no episodes of itchiness, shortness of breath, joint pain, or muscle aches. She denies any PND, orthopnea, or palpitations. Her mate does relate a history of her snoring.  MEDICAL HISTORY: Past Medical History  Diagnosis Date  . Hypertension   . Gestational diabetes   . Ankle fracture, left   . Measles without mention of complication     Measles  . Measles without mention of complication     Measles  . Rubella without mention of complication     Rubella  . Nexplanon insertion 03/19/2013    Inserted 03/19/13 left arm  . DUB (dysfunctional uterine bleeding) 06/04/2013  . Abnormal Pap smear   . Depression 08/06/2013  . Anemia   . Irregular heart rate   . Blood transfusion, without reported diagnosis     INTERIM HISTORY: has HYPERLIPIDEMIA; HYPOCALCEMIA; OBESITY NOS; MICROCYTIC ANEMIA; DEPRESSION/ANXIETY; MIGRAINE HEADACHE; HYPERTENSION; OVARIAN CYST, RIGHT; GESTATIONAL DIABETES; SKIN RASH; History of cesarean section, low transverse; Nexplanon insertion;  Abnormal Pap smear, atypical squamous cells of undetermined sign (ASC-US); DUB (dysfunctional uterine bleeding); and Depression on her problem list.    ALLERGIES:  is allergic to feraheme and penicillins.  MEDICATIONS: has a current medication list which includes the following prescription(s): etonogestrel, ibuprofen, ferric carboxymaltose, megestrol, and polysaccharide iron complex.  SURGICAL HISTORY:  Past Surgical History  Procedure Laterality Date  . Cholecystectomy    . Cesarean section    . Cyst removed      FAMILY HISTORY: family history includes Cancer in her other, paternal aunt, and paternal grandfather; Heart disease in her father; Hypertension in her father and mother.  SOCIAL HISTORY:  reports that she has quit smoking. She has never used smokeless tobacco. She reports that she does not drink alcohol or use illicit drugs.  REVIEW OF SYSTEMS:  Other than that discussed above is noncontributory.  PHYSICAL EXAMINATION: ECOG PERFORMANCE STATUS: 1 - Symptomatic but completely ambulatory  Blood pressure 162/89, pulse 81, temperature 97.8 F (36.6 C), temperature source Oral, resp. rate 18, weight 316 lb 9.6 oz (143.609 kg), not currently breastfeeding.  GENERAL:alert, no distress and comfortable. Morbidly obese. SKIN: skin color, texture, turgor are normal, no rashes or significant lesions EYES: PERLA; Conjunctiva are pink and non-injected, sclera clear SINUSES: No redness or tenderness over maxillary or ethmoid sinuses OROPHARYNX:no exudate, no erythema on lips, buccal mucosa, or tongue. NECK: supple, thyroid normal size, non-tender, without nodularity. No masses CHEST: Normal AP diameter with no breast masses. LYMPH:  no palpable lymphadenopathy in the cervical, axillary or inguinal LUNGS: clear to auscultation and percussion with normal  breathing effort HEART: regular rate & rhythm and no murmurs. ABDOMEN:abdomen soft, non-tender and normal bowel  sounds MUSCULOSKELETAL:no cyanosis of digits and no clubbing. Range of motion normal.  NEURO: alert & oriented x 3 with fluent speech, no focal motor/sensory deficits   LABORATORY DATA: Lab on 07/03/2014  Component Date Value Ref Range Status  . WBC 07/03/2014 6.2  4.0 - 10.5 K/uL Final  . RBC 07/03/2014 5.53* 3.87 - 5.11 MIL/uL Final  . Hemoglobin 07/03/2014 12.8  12.0 - 15.0 g/dL Final  . HCT 54/06/8118 41.0  36.0 - 46.0 % Final  . MCV 07/03/2014 74.1* 78.0 - 100.0 fL Final  . MCH 07/03/2014 23.1* 26.0 - 34.0 pg Final  . MCHC 07/03/2014 31.2  30.0 - 36.0 g/dL Final  . RDW 14/78/2956 22.6* 11.5 - 15.5 % Final  . Platelets 07/03/2014 274  150 - 400 K/uL Final  . Neutrophils Relative % 07/03/2014 63  43 - 77 % Final  . Neutro Abs 07/03/2014 3.9  1.7 - 7.7 K/uL Final  . Lymphocytes Relative 07/03/2014 30  12 - 46 % Final  . Lymphs Abs 07/03/2014 1.8  0.7 - 4.0 K/uL Final  . Monocytes Relative 07/03/2014 4  3 - 12 % Final  . Monocytes Absolute 07/03/2014 0.3  0.1 - 1.0 K/uL Final  . Eosinophils Relative 07/03/2014 3  0 - 5 % Final  . Eosinophils Absolute 07/03/2014 0.2  0.0 - 0.7 K/uL Final  . Basophils Relative 07/03/2014 0  0 - 1 % Final  . Basophils Absolute 07/03/2014 0.0  0.0 - 0.1 K/uL Final    PATHOLOGY: No new pathology.  Urinalysis    Component Value Date/Time   COLORURINE YELLOW 06/27/2012 2107   APPEARANCEUR CLEAR 06/27/2012 2107   LABSPEC 1.025 06/27/2012 2107   PHURINE 6.5 06/27/2012 2107   GLUCOSEU NEGATIVE 06/27/2012 2107   HGBUR LARGE* 06/27/2012 2107   BILIRUBINUR NEGATIVE 06/27/2012 2107   KETONESUR NEGATIVE 06/27/2012 2107   PROTEINUR neg 01/10/2013 1042   PROTEINUR NEGATIVE 06/27/2012 2107   UROBILINOGEN 1.0 06/27/2012 2107   NITRITE neg 01/10/2013 1042   NITRITE NEGATIVE 06/27/2012 2107   LEUKOCYTESUR Negative 01/10/2013 1042    RADIOGRAPHIC STUDIES: No results found.  ASSESSMENT:  #1. 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   #2. Morbid obesity.  #3. Probable sleep apnea syndrome.       PLAN:  #1. Referral for sleep studies. #2. Followup in 3 months with CBC and ferritin.   All questions were answered. The patient knows to call the clinic with any problems, questions or concerns. We can certainly see the patient much sooner if necessary.   I spent 25 minutes counseling the patient face to face. The total time spent in the appointment was 30 minutes.    Maurilio Lovely, MD 07/03/2014 9:48 AM  DISCLAIMER:  This note was dictated with voice recognition software.  Similar sounding words can inadvertently be transcribed inaccurately and may not be corrected upon review.

## 2014-07-07 LAB — HEMOGLOBINOPATHY EVALUATION
HEMOGLOBIN OTHER: 0 %
HGB A2 QUANT: 2.1 % — AB (ref 2.2–3.2)
HGB A: 97.9 % — AB (ref 96.8–97.8)
HGB F QUANT: 0 % (ref 0.0–2.0)
HGB S QUANTITAION: 0 %

## 2014-07-17 ENCOUNTER — Other Ambulatory Visit (HOSPITAL_COMMUNITY): Payer: Self-pay | Admitting: Hematology and Oncology

## 2014-07-17 ENCOUNTER — Ambulatory Visit: Payer: BC Managed Care – PPO | Attending: Hematology and Oncology | Admitting: Sleep Medicine

## 2014-07-17 VITALS — Ht 65.0 in | Wt 317.0 lb

## 2014-07-17 DIAGNOSIS — G479 Sleep disorder, unspecified: Secondary | ICD-10-CM

## 2014-07-17 DIAGNOSIS — R5381 Other malaise: Secondary | ICD-10-CM | POA: Diagnosis present

## 2014-07-17 DIAGNOSIS — R5383 Other fatigue: Secondary | ICD-10-CM | POA: Diagnosis present

## 2014-07-17 DIAGNOSIS — R0609 Other forms of dyspnea: Secondary | ICD-10-CM | POA: Diagnosis present

## 2014-07-17 DIAGNOSIS — R51 Headache: Secondary | ICD-10-CM | POA: Diagnosis present

## 2014-07-17 DIAGNOSIS — G4733 Obstructive sleep apnea (adult) (pediatric): Secondary | ICD-10-CM | POA: Insufficient documentation

## 2014-07-18 NOTE — Sleep Study (Signed)
  [facility] Mckay Brandt A. Gerilyn Pilgrim, MD     [email or web address]        NOCTURNAL POLYSOMNOGRAM    LOCATION: SLEEP LAB FACILITY: [facility]   PHYSICIAN: Nashali Ditmer A. Gerilyn Pilgrim, M.D.   DATE OF STUDY: 07/17/2014.   REFERRING PHYSICIAN: Alla German.   INDICATIONS: The patient 33 year old who presents with fatigue, snoring and restless sleep. There is also headaches in the morning on awakening.  MEDICATIONS:  Prior to Admission medications   Medication Sig Start Date End Date Taking? Authorizing Provider  etonogestrel (NEXPLANON) 68 MG IMPL implant Inject 1 each into the skin once.    Historical Provider, MD  Ferric Carboxymaltose 750 MG/15ML SOLN Inject 15 mLs (750 mg total) into the vein once. 06/09/14   Alla German, MD  ibuprofen (ADVIL,MOTRIN) 200 MG tablet Take 200 mg by mouth every 6 (six) hours as needed.    Historical Provider, MD  megestrol (MEGACE) 40 MG tablet Take 3 x 5 days then 2 x 5 days then 1 daily 06/04/13   Adline Potter, NP  Polysaccharide Iron Complex (POLY-IRON 150 PO) Take 1 tablet by mouth daily.    Historical Provider, MD      EPWORTH SLEEPINESS SCALE: 3.   BMI: 53.   ARCHITECTURAL SUMMARY: Total recording time was 454 minutes. Sleep efficiency 87 %. Sleep latency 17 minutes. REM latency 109 minutes. Stage NI 5 %, N2 72 % and N3 8 % and REM sleep 16 %.    RESPIRATORY DATA:  Baseline oxygen saturation is 98 %. The lowest saturation is 86 %. The diagnostic AHI is 15. The RDI is 15. The REM AHI is 70.  LIMB MOVEMENT SUMMARY: PLM index 6.   ELECTROCARDIOGRAM SUMMARY: Average heart rate is 73 with no significant dysrhythmias observed.   IMPRESSION:  1. Moderate REM related obstructive sleep apnea syndrome. A formal CPAP titration recording is suggested.  Thanks for this referral.  Oddie Kuhlmann A. Gerilyn Pilgrim, M.D. Diplomat, Biomedical engineer of Sleep Medicine.

## 2014-08-03 ENCOUNTER — Other Ambulatory Visit (HOSPITAL_COMMUNITY): Payer: Self-pay | Admitting: Hematology and Oncology

## 2014-08-03 DIAGNOSIS — G4733 Obstructive sleep apnea (adult) (pediatric): Secondary | ICD-10-CM

## 2014-08-04 ENCOUNTER — Encounter (HOSPITAL_COMMUNITY): Payer: Self-pay | Admitting: Lab

## 2014-08-04 NOTE — Progress Notes (Signed)
Called patient to inform her of appointment at [facility] on Oct 19.  She is aware of on 10/13

## 2014-08-24 ENCOUNTER — Encounter (HOSPITAL_COMMUNITY): Payer: Self-pay

## 2014-10-03 NOTE — Progress Notes (Signed)
-  No show-  Heather Moyer  

## 2014-10-05 ENCOUNTER — Ambulatory Visit (HOSPITAL_COMMUNITY): Payer: Medicaid Other | Admitting: Oncology

## 2014-10-05 ENCOUNTER — Other Ambulatory Visit (HOSPITAL_COMMUNITY): Payer: Medicaid Other

## 2015-09-15 ENCOUNTER — Ambulatory Visit (INDEPENDENT_AMBULATORY_CARE_PROVIDER_SITE_OTHER): Payer: PRIVATE HEALTH INSURANCE | Admitting: Obstetrics and Gynecology

## 2015-09-15 ENCOUNTER — Encounter: Payer: Self-pay | Admitting: Obstetrics and Gynecology

## 2015-09-15 VITALS — BP 150/86 | Ht 64.0 in | Wt 331.0 lb

## 2015-09-15 DIAGNOSIS — Z01419 Encounter for gynecological examination (general) (routine) without abnormal findings: Secondary | ICD-10-CM | POA: Diagnosis not present

## 2015-09-15 DIAGNOSIS — Z0000 Encounter for general adult medical examination without abnormal findings: Secondary | ICD-10-CM

## 2015-09-15 NOTE — Progress Notes (Signed)
Patient ID: Heather Moyer, female   DOB: 07/12/1981, 34 y.o.   MRN: [id number]  Assessment:  1.Annual Gyn Exam 2. Contraception council 3. Obesity 4.. Acanthosis nigricans 5. Small subcutaneous lipoma in skin under the right axilla 1 cm, suspected benign    Plan:  1. pap smear not done, last pap was 08/06/13 with neg HPV- next pap due in 1 year 2. Discussed weight loss strategies 3. return in 1 month to discuss contraception  Subjective:  Heather Moyer is a 34 y.o. female [phone] who presents for annual exam. No LMP recorded. Patient has had an implant. The patient is here to discuss tubal. She is [facility] Pt notes that without birth control her periods are not regular. She also wants to discuss weight issues that she is having, she states that her weight issue is related to her h/o depression and when she gets depressed is when she tends to put on weight. Pt reports she was dx with gout in her arms but the results were inconclusive because "the ER dx her with it but her PCP said that she did not have it". She states she has been trying to exercise but has still not been losing weight. She also notes she has a '"knot" she found on the right breast under her arm; she denies any pain to the knot. Her most recent pap was on 08/06/13 and was normal with negative HPV.    The following portions of the patient's history were reviewed and updated as appropriate: allergies, current medications, past family history, past medical history, past social history, past surgical history and problem list. Past Medical History  Diagnosis Date  . Hypertension   . Gestational diabetes   . Ankle fracture, left   . Measles without mention of complication     Measles  . Measles without mention of complication     Measles  . Rubella without mention of complication     Rubella  . Nexplanon insertion 03/19/2013    Inserted 03/19/13 left arm  . DUB (dysfunctional uterine bleeding)  06/04/2013  . Abnormal Pap smear   . Depression 08/06/2013  . Anemia   . Irregular heart rate   . Blood transfusion, without reported diagnosis     Past Surgical History  Procedure Laterality Date  . Cholecystectomy    . Cesarean section    . Cyst removed       Current outpatient prescriptions:  .  etonogestrel (NEXPLANON) 68 MG IMPL implant, Inject 1 each into the skin once., Disp: , Rfl:  .  ibuprofen (ADVIL,MOTRIN) 200 MG tablet, Take 200 mg by mouth every 6 (six) hours as needed., Disp: , Rfl:   Review of Systems Constitutional: negative Gastrointestinal: negative Genitourinary: negative  Objective:  BP 150/86 mmHg  Ht 5\' 4"  (1.626 m)  Wt 331 lb (150.141 kg)  BMI 56.79 kg/m2   BMI: Body mass index is 56.79 kg/(m^2).  General Appearance: Alert, appropriate appearance for age. No acute distress Breast Exam: No masses or nodes.No dimpling, nipple retraction or discharge.  Gastrointestinal: Soft, non-tender, no masses or organomegaly Pelvic Exam: Vulva and vagina appear normal. Bimanual exam reveals normal uterus and adnexa. long vaginal length, cervix is barely retractable Rectovaginal: not indicated Skin: Small subcutaneous lipoma in skin under the right axilla 1 cm, not worrisome Neurologic: Normal gait and speech, no tremor  Psychiatric: Alert and oriented, appropriate affect.  Urinalysis:Not done  Christin BachJohn Benjamyn Hestand. MD Pgr [phone] 10:12 AM  By signing my name below, I, Jarvis Morgan, attest that this documentation has been prepared under the direction and in the presence of Tilda Burrow, MD. Electronically Signed: Jarvis Morgan, ED Scribe. 09/15/2015. 10:31 AM.  I personally performed the services described in this documentation, which was SCRIBED in my presence. The recorded information has been reviewed and considered accurate. It has been edited as necessary during review. Tilda Burrow, MD

## 2015-09-15 NOTE — Progress Notes (Signed)
Patient ID: Heather Moyer, female   DOB: 11/24/1980, 34 y.o.   MRN: [id number] Pt here today for annual exam. Pt's most recent pap was 08/06/13 and was normal with negative HPV. Pt states that she wants to discuss tubal. Pt states that she wants to discuss some weight issues she is having as well. Pt states that she would also like Dr. Emelda FearFerguson to take a look at a knot she has found on the right breast under her arm, doesn't cause any pain it's just there.

## 2015-10-15 ENCOUNTER — Ambulatory Visit: Payer: PRIVATE HEALTH INSURANCE | Admitting: Obstetrics and Gynecology

## 2015-10-22 ENCOUNTER — Ambulatory Visit: Payer: PRIVATE HEALTH INSURANCE | Admitting: Obstetrics and Gynecology

## 2015-10-28 ENCOUNTER — Ambulatory Visit: Payer: PRIVATE HEALTH INSURANCE | Admitting: Obstetrics and Gynecology

## 2015-10-28 ENCOUNTER — Encounter: Payer: Self-pay | Admitting: Obstetrics and Gynecology

## 2015-10-28 ENCOUNTER — Ambulatory Visit (INDEPENDENT_AMBULATORY_CARE_PROVIDER_SITE_OTHER): Payer: PRIVATE HEALTH INSURANCE | Admitting: Obstetrics and Gynecology

## 2015-10-28 VITALS — BP 146/80 | Ht 64.0 in | Wt 336.0 lb

## 2015-10-28 DIAGNOSIS — Z3040 Encounter for surveillance of contraceptives, unspecified: Secondary | ICD-10-CM | POA: Insufficient documentation

## 2015-10-28 DIAGNOSIS — Z3009 Encounter for other general counseling and advice on contraception: Secondary | ICD-10-CM

## 2015-10-28 DIAGNOSIS — E669 Obesity, unspecified: Secondary | ICD-10-CM

## 2015-10-28 NOTE — Progress Notes (Signed)
Patient ID: Heather Moyer, female   DOB: 1981-06-13, 35 y.o.   MRN: [id number]   [facility] Visit  Patient name: Heather Moyer MRN [id number]  Date of birth: May 20, 1981  CC & HPI:  Heather Moyer is a 35 y.o. female presenting today for follow-up discussion of contraception. Patient states she is currently on Nexplanon. Patient notes having menorrhagia and metrorrhagia since menarche, but she states this issue has been resolved since she started using Nexplanon. Patient had been seeing Dellis Anes, PA-C, for anemia but had multiple blood and iron transfusions; the last transfusion was given about 2 years ago. She states she has not had anemia since.   ROS:  A complete 10 system review of systems was obtained and all systems are negative except as noted in the HPI and PMH.    Pertinent History Reviewed:   Reviewed: Significant for Cesarean section, depression Medical         Past Medical History  Diagnosis Date  . Hypertension   . Gestational diabetes   . Ankle fracture, left   . Measles without mention of complication     Measles  . Measles without mention of complication     Measles  . Rubella without mention of complication     Rubella  . Nexplanon insertion 03/19/2013    Inserted 03/19/13 left arm  . DUB (dysfunctional uterine bleeding) 06/04/2013  . Abnormal Pap smear   . Depression 08/06/2013  . Anemia   . Irregular heart rate   . Blood transfusion, without reported diagnosis                               Surgical Hx:    Past Surgical History  Procedure Laterality Date  . Cholecystectomy    . Cesarean section    . Cyst removed     Medications: Reviewed & Updated - see associated section                       Current outpatient prescriptions:  .  etonogestrel (NEXPLANON) 68 MG IMPL implant, Inject 1 each into the skin once., Disp: , Rfl:  .  ibuprofen (ADVIL,MOTRIN) 200 MG tablet, Take 200 mg by mouth every 6 (six) hours as needed., Disp: , Rfl:    Social  History: Reviewed -  reports that she has quit smoking. She has never used smokeless tobacco.  Objective Findings:  Vitals: Blood pressure 146/80, height 5\' 4"  (1.626 m), weight 336 lb (152.409 kg).  Physical Examination:  Pt here for discussion only.   Discussed with pt risks and benefits of contraceptive/sterilization options. At end of discussion, pt had opportunity to ask questions and has no further questions at this time.   Greater than 50% was spent in counseling and coordination of care with the patient.  Total time greater than: 25 minutes    Assessment & Plan:   A:  1. Contraceptive options discussed at length. Pt elects to have IUD.  2. Obesity. Weight loss strongly encouraged and strategies discussed.  P:  1. F/u in 6-8 weeks for IUD insertion.  And removal of Nexplanon  By signing my name below, I, Ronney Lion, attest that this documentation has been prepared under the direction and in the presence of Tilda Burrow, MD. Electronically Signed: Ronney Lion, ED Scribe. 10/28/2015. 11:39 AM.   I personally performed the services described in this documentation, which  was SCRIBED in my presence. The recorded information has been reviewed and considered accurate. It has been edited as necessary during review. Tilda BurrowFERGUSON,Lynsey Ange V, MD

## 2015-10-28 NOTE — Patient Instructions (Signed)
Levonorgestrel intrauterine device (IUD) What is this medicine? LEVONORGESTREL IUD (LEE voe nor jes trel) is a contraceptive (birth control) device. The device is placed inside the uterus by a healthcare professional. It is used to prevent pregnancy and can also be used to treat heavy bleeding that occurs during your period. Depending on the device, it can be used for 3 to 5 years. This medicine may be used for other purposes; ask your health care provider or pharmacist if you have questions. What should I tell my health care provider before I take this medicine? They need to know if you have any of these conditions: -abnormal Pap smear -cancer of the breast, uterus, or cervix -diabetes -endometritis -genital or pelvic infection now or in the past -have more than one sexual partner or your partner has more than one partner -heart disease -history of an ectopic or tubal pregnancy -immune system problems -IUD in place -liver disease or tumor -problems with blood clots or take blood-thinners -use intravenous drugs -uterus of unusual shape -vaginal bleeding that has not been explained -an unusual or allergic reaction to levonorgestrel, other hormones, silicone, or polyethylene, medicines, foods, dyes, or preservatives -pregnant or trying to get pregnant -breast-feeding How should I use this medicine? This device is placed inside the uterus by a health care professional. Talk to your pediatrician regarding the use of this medicine in children. Special care may be needed. Overdosage: If you think you have taken too much of this medicine contact a poison control center or emergency room at once. NOTE: This medicine is only for you. Do not share this medicine with others. What if I miss a dose? This does not apply. What may interact with this medicine? Do not take this medicine with any of the following medications: -amprenavir -bosentan -fosamprenavir This medicine may also interact with  the following medications: -aprepitant -barbiturate medicines for inducing sleep or treating seizures -bexarotene -griseofulvin -medicines to treat seizures like carbamazepine, ethotoin, felbamate, oxcarbazepine, phenytoin, topiramate -modafinil -pioglitazone -rifabutin -rifampin -rifapentine -some medicines to treat HIV infection like atazanavir, indinavir, lopinavir, nelfinavir, tipranavir, ritonavir -St. Lovena Kluck's wort -warfarin This list may not describe all possible interactions. Give your health care provider a list of all the medicines, herbs, non-prescription drugs, or dietary supplements you use. Also tell them if you smoke, drink alcohol, or use illegal drugs. Some items may interact with your medicine. What should I watch for while using this medicine? Visit your doctor or health care professional for regular check ups. See your doctor if you or your partner has sexual contact with others, becomes HIV positive, or gets a sexual transmitted disease. This product does not protect you against HIV infection (AIDS) or other sexually transmitted diseases. You can check the placement of the IUD yourself by reaching up to the top of your vagina with clean fingers to feel the threads. Do not pull on the threads. It is a good habit to check placement after each menstrual period. Call your doctor right away if you feel more of the IUD than just the threads or if you cannot feel the threads at all. The IUD may come out by itself. You may become pregnant if the device comes out. If you notice that the IUD has come out use a backup birth control method like condoms and call your health care provider. Using tampons will not change the position of the IUD and are okay to use during your period. What side effects may I notice from receiving this medicine?   Side effects that you should report to your doctor or health care professional as soon as possible: -allergic reactions like skin rash, itching or  hives, swelling of the face, lips, or tongue -fever, flu-like symptoms -genital sores -high blood pressure -no menstrual period for 6 weeks during use -pain, swelling, warmth in the leg -pelvic pain or tenderness -severe or sudden headache -signs of pregnancy -stomach cramping -sudden shortness of breath -trouble with balance, talking, or walking -unusual vaginal bleeding, discharge -yellowing of the eyes or skin Side effects that usually do not require medical attention (report to your doctor or health care professional if they continue or are bothersome): -acne -breast pain -change in sex drive or performance -changes in weight -cramping, dizziness, or faintness while the device is being inserted -headache -irregular menstrual bleeding within first 3 to 6 months of use -nausea This list may not describe all possible side effects. Call your doctor for medical advice about side effects. You may report side effects to FDA at 1-800-FDA-1088. Where should I keep my medicine? This does not apply. NOTE: This sheet is a summary. It may not cover all possible information. If you have questions about this medicine, talk to your doctor, pharmacist, or health care provider.    2016, Elsevier/Gold Standard. (2011-11-09 13:54:04)  

## 2015-10-28 NOTE — Progress Notes (Signed)
Patient ID: Heather Moyer, female   DOB: 06/23/1981, 35 y.o.   MRN: [id number] Pt here today for follow up. Pt states that she is concerned about her weight and some swelling she has going on.

## 2016-01-12 ENCOUNTER — Ambulatory Visit: Payer: PRIVATE HEALTH INSURANCE | Admitting: Obstetrics and Gynecology

## 2016-01-20 ENCOUNTER — Ambulatory Visit (INDEPENDENT_AMBULATORY_CARE_PROVIDER_SITE_OTHER): Payer: PRIVATE HEALTH INSURANCE | Admitting: Obstetrics and Gynecology

## 2016-01-20 ENCOUNTER — Encounter: Payer: Self-pay | Admitting: Obstetrics and Gynecology

## 2016-01-20 VITALS — BP 120/82 | Ht 65.0 in | Wt 339.0 lb

## 2016-01-20 DIAGNOSIS — Z3046 Encounter for surveillance of implantable subdermal contraceptive: Secondary | ICD-10-CM

## 2016-01-20 DIAGNOSIS — Z30014 Encounter for initial prescription of intrauterine contraceptive device: Secondary | ICD-10-CM

## 2016-01-20 DIAGNOSIS — Z30430 Encounter for insertion of intrauterine contraceptive device: Secondary | ICD-10-CM | POA: Diagnosis not present

## 2016-01-20 NOTE — Progress Notes (Signed)
Patient ID: Heather Moyer, female   DOB: 09/01/1981, 35 y.o.   MRN: [id number] GYNECOLOGY CLINIC PROCEDURE NOTE Heather Peabony Q Seyfried is a 35 y.o. [phone] here for Nexplanon removal and IUD insertion.  No other gynecologic concerns.  Nexplanon Removal Patient identified, informed consent performed, consent signed.   Appropriate time out taken. Nexplanon site identified.  Area prepped in usual sterile fashon. 0.5 ml of 1% lidocaine was used to anesthetize the area at the distal end of the implant. A small stab incision was made right beside the implant on the distal portion.  The Nexplanon rod was grasped using hemostats and removed without difficulty. There was minimal blood loss. There were no complications. Steri-strips were applied over the small incision.  A pressure bandage was applied to reduce any bruising.  The patient tolerated the procedure well and was given post procedure instructions.  Patient is planning to use IUD for contraception/attempt conception.  [facility] PROCEDURE NOTE- IUD INSERT    Heather Peabony Q Fajardo is a 35 y.o. [phone] here for ([id number])Mirena IUD insertion  No GYN concerns.   Last pap smear was on 09/15/15 and was normal.  GC/CHL not done today  IUD Insertion Procedure Note Patient identified, informed consent performed, consent signed.   Discussed risks of irregular bleeding, cramping, infection, malpositioning or misplacement of the IUD outside the uterus which may require further procedure such as laparoscopy. Time out was performed.  Urine pregnancy test negative.  Speculum inserted, Cervix visualized.  Cleaned with Betadine x 2.   Grasped anteriorly with a single tooth tenaculum.  Uterus sounded to 7.5 cm.  Mirena IUD placed per manufacturer's recommendations.   Strings trimmed to 3 cm.  Tenaculum was removed, good hemostasis noted.  Patient tolerated procedure well.   Patient was given post-procedure instructions. She was advised to have backup contraception  for one week.  Patient was also asked to check IUD strings periodically and follow up in 4 weeks for IUD check.     By signing my name below, I, [name], attest that this documentation has been prepared under the direction and in the presence of Christin BachJohn Jevaughn Degollado, MD. Electronically Signed: Marica OtterNusrat Rahman, ED Scribe. 01/20/2016. 4:41 PM.   I personally performed the services described in this documentation, which was SCRIBED in my presence. The recorded information has been reviewed and considered accurate. It has been edited as necessary during review. Tilda BurrowFERGUSON,Almee Pelphrey V, MD

## 2016-02-17 ENCOUNTER — Other Ambulatory Visit (INDEPENDENT_AMBULATORY_CARE_PROVIDER_SITE_OTHER): Payer: PRIVATE HEALTH INSURANCE

## 2016-02-17 ENCOUNTER — Ambulatory Visit (INDEPENDENT_AMBULATORY_CARE_PROVIDER_SITE_OTHER): Payer: PRIVATE HEALTH INSURANCE | Admitting: Obstetrics and Gynecology

## 2016-02-17 ENCOUNTER — Other Ambulatory Visit: Payer: Self-pay | Admitting: Obstetrics and Gynecology

## 2016-02-17 ENCOUNTER — Encounter: Payer: Self-pay | Admitting: Obstetrics and Gynecology

## 2016-02-17 VITALS — BP 132/80 | HR 82 | Ht 65.0 in | Wt 341.0 lb

## 2016-02-17 DIAGNOSIS — N854 Malposition of uterus: Secondary | ICD-10-CM

## 2016-02-17 DIAGNOSIS — Z30431 Encounter for routine checking of intrauterine contraceptive device: Secondary | ICD-10-CM

## 2016-02-17 NOTE — Progress Notes (Signed)
Patient ID: Heather Moyer, female   DOB: 03/18/1981, 35 y.o.   MRN: [id number]   [facility]  @[facility] @            Patient name: Heather Moyer MRN [id number]  Date of birth: 09/18/1981  CC & HPI:  Heather Moyer is a 35 y.o. female presenting today for iud recheck. Insertion Likely difficult to the patient's very elongated anteverted vaginal axis and difficult to identify cervix we did nonetheless feel that the IUD placement went well  ROS:  ROS   Pertinent History Reviewed:   Reviewed: Significant for Light spotting persists Medical         Past Medical History  Diagnosis Date  . Hypertension   . Gestational diabetes   . Ankle fracture, left   . Measles without mention of complication     Measles  . Measles without mention of complication     Measles  . Rubella without mention of complication     Rubella  . Nexplanon insertion 03/19/2013    Inserted 03/19/13 left arm  . DUB (dysfunctional uterine bleeding) 06/04/2013  . Abnormal Pap smear   . Depression 08/06/2013  . Anemia   . Irregular heart rate   . Blood transfusion, without reported diagnosis                               Surgical Hx:    Past Surgical History  Procedure Laterality Date  . Cholecystectomy    . Cesarean section    . Cyst removed     Medications: Reviewed & Updated - see associated section                       Current outpatient prescriptions:  .  digoxin (LANOXIN) 0.125 MG tablet, Take by mouth., Disp: , Rfl:  .  ibuprofen (ADVIL,MOTRIN) 200 MG tablet, Take 200 mg by mouth every 6 (six) hours as needed., Disp: , Rfl:  .  levonorgestrel (MIRENA) 20 MCG/24HR IUD, 1 each by Intrauterine route once., Disp: , Rfl:  .  metoprolol (LOPRESSOR) 50 MG tablet, Take 50 mg by mouth daily. , Disp: , Rfl:  .  etonogestrel (NEXPLANON) 68 MG IMPL implant, Inject 1 each into the skin once. Reported on 02/17/2016, Disp: , Rfl:    Social History: Reviewed -  reports that she has quit smoking. She has  never used smokeless tobacco.  Objective Findings:  Vitals: Blood pressure 132/80, pulse 82, height 5\' 5"  (1.651 m), weight 341 lb (154.677 kg).  Physical Examination: General appearance - alert, well appearing, and in no distress and oriented to person, place, and time Pelvic - normal external genitalia, vulva, vagina, cervix, uterus and adnexa, UTERUS: uterus is normal size, shape, consistency and nontender, anteverted, mobile U/s required to have any idea of IUD location, and this u/s allowed ;u/s to confirm IU as being intrauterrine.  Assessment & Plan:   A:  1. Proper IUD placement  P:  1. rechekc IUD planning.

## 2016-02-17 NOTE — Progress Notes (Signed)
LIMITED TV PELVIC US to check IUD placement: retroflexed uterus, IUD appears to be centrally located w/in the endometrium,limited view of uterus, but was visualized with transabdominal pressure.

## 2017-11-21 ENCOUNTER — Ambulatory Visit (INDEPENDENT_AMBULATORY_CARE_PROVIDER_SITE_OTHER): Payer: PRIVATE HEALTH INSURANCE | Admitting: Advanced Practice Midwife

## 2017-11-21 ENCOUNTER — Encounter: Payer: Self-pay | Admitting: Advanced Practice Midwife

## 2017-11-21 VITALS — BP 128/80 | Ht 64.0 in | Wt 309.0 lb

## 2017-11-21 DIAGNOSIS — N898 Other specified noninflammatory disorders of vagina: Secondary | ICD-10-CM | POA: Diagnosis not present

## 2017-11-21 NOTE — Patient Instructions (Signed)
Vaginal moisturizer, use as directed

## 2017-11-21 NOTE — Progress Notes (Signed)
[facility] Visit  Patient name: Heather Moyer MRN [id number]  Date of birth: 02/18/1981  CC & HPI:  Heather Moyer is a 37 y.o. African American female presenting today for c/o vaginal dryness that started 2-3 months after getting her Mirena IUD 6 months ago. Not having sex anymore, but feels dry all the time. Was fine w/Nexplanon. May want to go back on that  Pertinent History Reviewed:  Medical & Surgical Hx:   Past Medical History:  Diagnosis Date  . Abnormal Pap smear   . Anemia   . Ankle fracture, left   . Blood transfusion, without reported diagnosis   . Depression 08/06/2013  . DUB (dysfunctional uterine bleeding) 06/04/2013  . Gestational diabetes   . Hypertension   . Irregular heart rate   . Measles without mention of complication    Measles  . Measles without mention of complication    Measles  . Nexplanon insertion 03/19/2013   Inserted 03/19/13 left arm  . Rubella without mention of complication    Rubella   Past Surgical History:  Procedure Laterality Date  . CESAREAN SECTION    . CHOLECYSTECTOMY    . cyst removed     Family History  Problem Relation Age of Onset  . Hypertension Mother   . Hypertension Father   . Heart disease Father        cardiac arrest  . Cancer Paternal Aunt        stomach  . Cancer Paternal Grandfather        throat  . Cancer Other     Current Outpatient Medications:  .  atenolol (TENORMIN) 25 MG tablet, Take by mouth daily., Disp: , Rfl:  .  digoxin (LANOXIN) 0.125 MG tablet, Take by mouth., Disp: , Rfl:  .  ibuprofen (ADVIL,MOTRIN) 200 MG tablet, Take 200 mg by mouth every 6 (six) hours as needed., Disp: , Rfl:  .  levonorgestrel (MIRENA) 20 MCG/24HR IUD, 1 each by Intrauterine route once., Disp: , Rfl:  Social History: Reviewed -  reports that she has quit smoking. she has never used smokeless tobacco.  Review of Systems:   Constitutional: Negative for fever and chills Eyes: Negative for visual  disturbances Respiratory: Negative for shortness of breath, dyspnea Cardiovascular: Negative for chest pain or palpitations  Gastrointestinal: Negative for vomiting, diarrhea and constipation; no abdominal pain Genitourinary: Negative for dysuria and urgency, vaginal irritation or itching Musculoskeletal: Negative for back pain, joint pain, myalgias  Neurological: Negative for dizziness and headaches    Objective Findings:    Physical Examination: General appearance - well appearing, and in no distress Mental status - alert, oriented to person, place, and time Chest:  Normal respiratory effort Heart - normal rate and regular rhythm Abdomen:  Soft, nontender Pelvic: normal appearing vagina, pink, good rugae, IUD strings visible Musculoskeletal:  Normal range of motion without pain Extremities:  No edema    No results found for this or any previous visit (from the past 24 hour(s)).    Assessment & Plan:  A:   Perceived vaginal dryness 2/2 IUD P:  Vaginal moisturizer for now.  Check ins re Nexplanon.    Return for [employer] insurance for nexplanon.  CRESENZO-DISHMAN,Shalayah Beagley CNM 11/21/2017 3:23 PM

## 2017-12-06 ENCOUNTER — Ambulatory Visit (INDEPENDENT_AMBULATORY_CARE_PROVIDER_SITE_OTHER): Payer: PRIVATE HEALTH INSURANCE | Admitting: Advanced Practice Midwife

## 2017-12-06 ENCOUNTER — Encounter: Payer: Self-pay | Admitting: Advanced Practice Midwife

## 2017-12-06 VITALS — BP 140/80 | HR 92 | Ht 64.0 in | Wt 311.4 lb

## 2017-12-06 DIAGNOSIS — Z30432 Encounter for removal of intrauterine contraceptive device: Secondary | ICD-10-CM | POA: Diagnosis not present

## 2017-12-06 DIAGNOSIS — Z3202 Encounter for pregnancy test, result negative: Secondary | ICD-10-CM | POA: Diagnosis not present

## 2017-12-06 DIAGNOSIS — Z3046 Encounter for surveillance of implantable subdermal contraceptive: Secondary | ICD-10-CM

## 2017-12-06 DIAGNOSIS — Z30017 Encounter for initial prescription of implantable subdermal contraceptive: Secondary | ICD-10-CM

## 2017-12-06 LAB — POCT URINE PREGNANCY: PREG TEST UR: NEGATIVE

## 2017-12-06 MED ORDER — ETONOGESTREL 68 MG ~~LOC~~ IMPL
68.0000 mg | DRUG_IMPLANT | Freq: Once | SUBCUTANEOUS | Status: AC
  Administered 2017-12-06: 68 mg via SUBCUTANEOUS

## 2017-12-07 NOTE — Progress Notes (Signed)
  Heather Moyer 36 y.o.  Vitals:   12/06/17 1132  BP: 140/80  Pulse: 92   Past Medical History:  Diagnosis Date  . Abnormal Pap smear   . Anemia   . Ankle fracture, left   . Blood transfusion, without reported diagnosis   . Depression 08/06/2013  . DUB (dysfunctional uterine bleeding) 06/04/2013  . Gestational diabetes   . Hypertension   . Irregular heart rate   . Measles without mention of complication    Measles  . Measles without mention of complication    Measles  . Nexplanon insertion 03/19/2013   Inserted 03/19/13 left arm  . Rubella without mention of complication    Rubella   Past Surgical History:  Procedure Laterality Date  . CESAREAN SECTION    . CHOLECYSTECTOMY    . cyst removed     family history includes Cancer in her other, paternal aunt, and paternal grandfather; Heart disease in her father; Hypertension in her father and mother.  Current Outpatient Medications:  .  atenolol (TENORMIN) 25 MG tablet, Take by mouth daily., Disp: , Rfl:  .  digoxin (LANOXIN) 0.125 MG tablet, Take by mouth., Disp: , Rfl:  .  levonorgestrel (MIRENA) 20 MCG/24HR IUD, 1 each by Intrauterine route once., Disp: , Rfl:  .  ibuprofen (ADVIL,MOTRIN) 200 MG tablet, Take 200 mg by mouth every 6 (six) hours as needed., Disp: , Rfl:     Here for IUD removal.  She had the Mirena IUD placed last year and would like it removed because she thinks it is giving her vagnal dryness. Her plans for future contraception are Nexplanon, which she has had in the past and likes it.  A graves speculum was placed, the cervix was very high and posterior; dr Despina Hiddeneure helped at this point and the strings werennot visible.The cx was dilated several times and a straight Tresa EndoKelly was inserted into cx and the IUD was found and removed.  Pt given IUD removal f/u instructions.   her pregnancy test today was negative.  Risks/benefits/side effects of Nexplanon have been discussed and her questions have been answered.   Specifically, a failure rate of 10/998 has been reported, with an increased failure rate if pt takes St. John's Wort and/or antiseizure medicaitons.  Heather Moyer is aware of the common side effect of irregular bleeding, which the incidence of decreases over time.     Her left arm, approximatly 4 inches proximal from the elbow, was cleansed with alcohol and anesthetized with 2cc of 2% Lidocaine.  The area was cleansed again and the Nexplanon was inserted without difficulty.  A pressure bandage was applied.  Pt was instructed to remove pressure bandage in a few hours, and keep insertion site covered with a bandaid for 3 days.  Follow-up scheduled PRN problems  Jacklyn ShellFrances Cresenzo-Dishmon 12/07/2017 12:09 AM

## 2017-12-20 ENCOUNTER — Ambulatory Visit: Payer: PRIVATE HEALTH INSURANCE | Admitting: Obstetrics & Gynecology

## 2017-12-20 ENCOUNTER — Telehealth: Payer: Self-pay | Admitting: *Deleted

## 2017-12-20 ENCOUNTER — Encounter: Payer: Self-pay | Admitting: Obstetrics & Gynecology

## 2017-12-20 DIAGNOSIS — Z3046 Encounter for surveillance of implantable subdermal contraceptive: Secondary | ICD-10-CM | POA: Diagnosis not present
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
## 2017-12-20 NOTE — Progress Notes (Signed)
Chief Complaint  Patient presents with  . w/i    nexplanon broken      37 y.o. [id number] No LMP recorded. Patient has had an implant. The current method of family planning is Nexplanon placed 2 weeks ago.  Outpatient Encounter Medications as of 12/20/2017  Medication Sig Note  . atenolol (TENORMIN) 25 MG tablet Take by mouth daily.   . digoxin (LANOXIN) 0.125 MG tablet Take by mouth. 01/20/2016: Received from: [facility]  . ibuprofen (ADVIL,MOTRIN) 200 MG tablet Take 200 mg by mouth every 6 (six) hours as needed.   Marland Kitchen levonorgestrel (MIRENA) 20 MCG/24HR IUD 1 each by Intrauterine route once.    No facility-administered encounter medications on file as of 12/20/2017.     Subjective Heather Moyer is seen after visiting [facility] emergency department stating that her Nexplanon was broken Interestingly she did not allow the emergency physician to examine the Nexplanon She states she experienced a hot flash and a tingling feeling in the same that the Nexplanon was broken Past Medical History:  Diagnosis Date  . Abnormal Pap smear   . Anemia   . Ankle fracture, left   . Blood transfusion, without reported diagnosis   . Depression 08/06/2013  . DUB (dysfunctional uterine bleeding) 06/04/2013  . Gestational diabetes   . Hypertension   . Irregular heart rate   . Measles without mention of complication    Measles  . Measles without mention of complication    Measles  . Nexplanon insertion 03/19/2013   Inserted 03/19/13 left arm  . Rubella without mention of complication    Rubella    Past Surgical History:  Procedure Laterality Date  . CESAREAN SECTION    . CHOLECYSTECTOMY    . cyst removed      OB History    Gravida Para Term Preterm AB Living   2 2 2     2    SAB TAB Ectopic Multiple Live Births           2      Allergies  Allergen Reactions  . Feraheme [Ferumoxytol] Anaphylaxis  . Penicillins Swelling and Other (See Comments)    REACTION: genital  swelling    Social History   Socioeconomic History  . Marital status: Single    Spouse name: None  . Number of children: None  . Years of education: None  . Highest education level: None  Social Needs  . Financial resource strain: None  . Food insecurity - worry: None  . Food insecurity - inability: None  . Transportation needs - medical: None  . Transportation needs - non-medical: None  Occupational History  . None  Tobacco Use  . Smoking status: Former Games developer  . Smokeless tobacco: Never Used  Substance and Sexual Activity  . Alcohol use: No  . Drug use: No  . Sexual activity: Not Currently    Birth control/protection: Implant  Other Topics Concern  . None  Social History Narrative  . None    Family History  Problem Relation Age of Onset  . Hypertension Mother   . Hypertension Father   . Heart disease Father        cardiac arrest  . Cancer Paternal Aunt        stomach  . Cancer Paternal Grandfather        throat  . Cancer Other     Medications:       Current Outpatient Medications:  .  atenolol (TENORMIN) 25 MG tablet, Take by mouth daily., Disp: , Rfl:  .  digoxin (LANOXIN) 0.125 MG tablet, Take by mouth., Disp: , Rfl:  .  ibuprofen (ADVIL,MOTRIN) 200 MG tablet, Take 200 mg by mouth every 6 (six) hours as needed., Disp: , Rfl:  .  levonorgestrel (MIRENA) 20 MCG/24HR IUD, 1 each by Intrauterine route once., Disp: , Rfl:   Objective There were no vitals taken for this visit.  General this is a well-developed well-nourished female no acute distress Her left arm is examined and the Nexplanon is palpated There is no erythema warmth swelling or bruising In addition the Nexplanon is intact I can press the bottom and in the top and raises and I compressed the top and the bottom and raises I can palpate the entire device and it is indeed intact  Pertinent ROS No burning with urination, frequency or urgency No nausea, vomiting or diarrhea Nor fever chills or  other constitutional symptoms   Labs or studies none    Impression Diagnoses this Encounter::   ICD-10-CM   1. Encounter for surveillance of Nexplanon subdermal contraceptive Z30.46     Established relevant diagnosis(es):   Plan/Recommendations: No orders of the defined types were placed in this encounter.   Labs or Scans Ordered: No orders of the defined types were placed in this encounter.   Management:: The Nexplanon that was placed in December 06, 2017 is indeed intact, it is definitely not broken and it is in the same location as when it was placed based on the small scar from the incision  I guess the patient could have experienced a hormonal related vasomotor symptoms because it is a relatively new Nexplanon but is certainly has nothing to do with the device itself or its placement  No further follow-up is needed for this issue  Follow up Return if symptoms worsen or fail to improve.      All questions were answered.

## 2018-11-15 ENCOUNTER — Encounter (HOSPITAL_COMMUNITY): Payer: Self-pay | Admitting: *Deleted

## 2018-11-15 ENCOUNTER — Emergency Department (HOSPITAL_COMMUNITY)
Admission: EM | Admit: 2018-11-15 | Discharge: 2018-11-15 | Disposition: A | Discharge status: Home / Self Care | Payer: Worker's Compensation | Attending: Emergency Medicine | Admitting: Emergency Medicine

## 2018-11-15 DIAGNOSIS — Y92410 Unspecified street and highway as the place of occurrence of the external cause: Secondary | ICD-10-CM | POA: Diagnosis not present

## 2018-11-15 DIAGNOSIS — Y998 Other external cause status: Secondary | ICD-10-CM | POA: Diagnosis not present

## 2018-11-15 DIAGNOSIS — S4992XA Unspecified injury of left shoulder and upper arm, initial encounter: Secondary | ICD-10-CM | POA: Diagnosis present

## 2018-11-15 DIAGNOSIS — Y93I9 Activity, other involving external motion: Secondary | ICD-10-CM | POA: Insufficient documentation

## 2018-11-15 MED ORDER — CYCLOBENZAPRINE HCL 10 MG PO TABS: 10.0000 mg | ORAL_TABLET | Freq: Three times a day (TID) | ORAL | 0 refills | Status: DC

## 2018-11-15 NOTE — Discharge Instructions (Addendum)
Your blood pressure slightly elevated today.  Please have this rechecked soon.  Please use Tylenol every 4 hours or ibuprofen every 6 hours for mild pain.  Use Flexeril 3 times daily as needed for spasm pain.  Flexeril may cause drowsiness, and/or lightheadedness.  Please use this medication with caution.  Please do not drive a vehicle, operate machinery, or participate in activities requiring concentration when taking this medication.
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
## 2018-11-15 NOTE — ED Notes (Signed)
HB in to assess 
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
## 2019-07-02 ENCOUNTER — Ambulatory Visit: Payer: Self-pay | Admitting: Orthopedic Surgery

## 2019-07-14 ENCOUNTER — Other Ambulatory Visit: Payer: Self-pay

## 2019-07-14 ENCOUNTER — Encounter: Payer: Self-pay | Admitting: Orthopedic Surgery

## 2019-07-14 ENCOUNTER — Ambulatory Visit (INDEPENDENT_AMBULATORY_CARE_PROVIDER_SITE_OTHER): Payer: Commercial Managed Care - PPO | Admitting: Orthopedic Surgery

## 2019-07-14 VITALS — BP 145/86 | HR 80 | Ht 64.0 in | Wt 367.0 lb

## 2019-07-14 DIAGNOSIS — S83001A Unspecified subluxation of right patella, initial encounter: Secondary | ICD-10-CM

## 2019-07-14 DIAGNOSIS — Z6841 Body Mass Index (BMI) 40.0 and over, adult: Secondary | ICD-10-CM | POA: Diagnosis not present

## 2019-07-14 MED ORDER — MELOXICAM 7.5 MG PO TABS: 7.5000 mg | ORAL_TABLET | Freq: Every day | ORAL | 5 refills | Status: DC

## 2019-07-14 NOTE — Progress Notes (Signed)
Heather Moyer  07/14/2019  HISTORY SECTION :  Chief Complaint  Patient presents with  . Knee Pain    right / pops since 38 years old    HPI The patient presents for evaluation of painful right knee since she was 38 years old worse in the last 2 years no prior treatment pain is located over the anterior right knee worse in the last 2 years dull ache moderate in severity associated with kneecap moving out of place   Review of Systems  Musculoskeletal: Positive for joint pain.  All other systems reviewed and are negative.    has a past medical history of Abnormal Pap smear, Anemia, Ankle fracture, left, Blood transfusion, without reported diagnosis, Depression (08/06/2013), DUB (dysfunctional uterine bleeding) (06/04/2013), Gestational diabetes, Hypertension, Irregular heart rate, Measles without mention of complication, Measles without mention of complication, Nexplanon insertion (03/19/2013), and Rubella without mention of complication.   Past Surgical History:  Procedure Laterality Date  . CESAREAN SECTION    . CHOLECYSTECTOMY    . cyst removed      Body mass index is 63 kg/m.   Allergies  Allergen Reactions  . Feraheme [Ferumoxytol] Anaphylaxis  . Penicillins Swelling and Other (See Comments)    REACTION: genital swelling     Current Outpatient Medications:  .  amitriptyline (ELAVIL) 25 MG tablet, amitriptyline 25 mg tablet  TAKE 1 TABLET BY MOUTH EVERY DAY, Disp: , Rfl:  .  atenolol (TENORMIN) 25 MG tablet, Take by mouth daily., Disp: , Rfl:  .  cyclobenzaprine (FLEXERIL) 10 MG tablet, Take 1 tablet (10 mg total) by mouth 3 (three) times daily., Disp: 20 tablet, Rfl: 0 .  levonorgestrel (MIRENA) 20 MCG/24HR IUD, 1 each by Intrauterine route once., Disp: , Rfl:  .  digoxin (LANOXIN) 0.125 MG tablet, Take by mouth., Disp: , Rfl:  .  ibuprofen (ADVIL,MOTRIN) 200 MG tablet, Take 200 mg by mouth every 6 (six) hours as needed., Disp: , Rfl:  .  meloxicam (MOBIC) 7.5 MG  tablet, Take 1 tablet (7.5 mg total) by mouth daily., Disp: 30 tablet, Rfl: 5 .  Vitamin D, Ergocalciferol, (DRISDOL) 1.25 MG (50000 UT) CAPS capsule, TAKE 1 CAPSULE BY MOUTH ONE TIME PER WEEK, Disp: , Rfl:    PHYSICAL EXAM SECTION: 1) BP (!) 145/86   Pulse 80   Ht 5\' 4"  (1.626 m)   Wt (!) 367 lb (166.5 kg)   BMI 63.00 kg/m   Body mass index is 63 kg/m. General appearance: Well-developed well-nourished no gross deformities  2) Cardiovascular normal pulse and perfusion in the lower  extremities normal color without edema  3) Neurologically deep tendon reflexes are equal and normal, no sensation loss or deficits no pathologic reflexes  4) Psychological: Awake alert and oriented x3 mood and affect normal  5) Skin no lacerations or ulcerations no nodularity no palpable masses, no erythema or nodularity  6) Musculoskeletal:  Left knee hyperextends as does the right range of motion is approximate 100 degrees no pain with range of motion no swelling  Right knee no effusion mild pain anteriorly in the patellofemoral region without subluxation dislocation or apprehension.  Again we find 100 degrees of motion no instability on the collateral ligaments or cruciate ligaments  Pes planus is noted bilaterally, it is flexible   MEDICAL DECISION SECTION:   The patient meets the AMA guidelines for Morbid (severe) obesity with a BMI > 40.0 and I have recommended weight loss.  Encounter Diagnoses  Name Primary?  Marland Kitchen  Body mass index 60.0-69.9, adult (HCC)   . Morbid obesity (HCC)   . Subluxation of right patella, initial encounter Yes    Imaging X-ray was taken outside facility  X-ray AP lateral right knee no fracture dislocation or acute process report read arthritis I do not really see any there.  Plan:  (Rx., Inj., surg., Frx, MRI/CT, XR:2)  The patient is 89 she has not had any surgical or medical treatment for that matter so I am going to recommend an anti-inflammatory  meloxicam Weight loss Physical therapy Orthotics to control the Pez planus  9:10 AM Heather CanadaStanley Heather Belzer, MD  07/14/2019

## 2019-07-14 NOTE — Addendum Note (Signed)
Addended byCandice Camp on: 07/14/2019 09:23 AM   Modules accepted: Orders

## 2019-07-14 NOTE — Patient Instructions (Signed)
Patellar Dislocation and Subluxation The kneecap (patella) is located in a groove at the end of the thigh bone (femur). Patellar dislocation and patellar subluxation are injuries that happen when the patella slips out of its normal position.  In a patellar subluxation, the patella slips partly out of the groove.  In a patellar dislocation, the patella slips all the way out of the groove. What are the causes? This condition may be caused by:  A hit to the knee.  Twisting the knee when the foot is planted. What increases the risk? You are more likely to develop this condition if you:  Are an athlete in your teens or 12s.  Have had this condition before.  Play certain kinds of sports, including sports that: ? Include quick turns, quick changes in direction, or contact with other players, like soccer. ? Require jumping, such as basketball or volleyball. ? Require that cleats are worn. What are the signs or symptoms? Symptoms of this condition include:  A feeling that the knee is buckling, followed by sudden extreme pain in the knee.  A deformed knee.  A popping sensation, followed by a feeling that something is out of place.  Inability to bend or straighten the knee.  Swelling in the knee. How is this diagnosed? This condition may be diagnosed with:  A physical exam.  An X-ray. This may be done to see the position of the patella or to see if a bone has broken.  An MRI. This may be done to look at the alignment of your knee and the ligaments that hold your patella in place. How is this treated? Your patella may move back into place on its own when you straighten your knee. If your patella does not move back into place on its own, your health care provider will move it back into place. After your patella is back in its normal position, you may be able to go back to your normal activities, or you may be treated further by:  Wearing a knee brace to keep your knee from moving  (immobilized) while it heals.  Doing exercises that help improve strength and movement in your knee.  Taking medicine to help with pain and inflammation.  Applying ice to the knee to help with pain and inflammation.  Having surgery to prevent the patella from slipping out of place or to clean out any loose cartilage in your joint. This may be needed if other treatments do not help or if the condition keeps happening. Follow these instructions at home: If you have a brace:  Wear the brace as told by your health care provider. Remove it only as told by your health care provider.  Loosen the brace if your toes tingle, become numb, or turn cold and blue.  Keep the brace clean.  If the brace is not waterproof: ? Do not let it get wet. ? Cover it with a watertight covering when you take a bath or shower. Managing pain, stiffness, and swelling   If directed, put ice on the affected area. ? If you have a removable brace, remove it as told by your health care provider. ? Put ice in a plastic bag. ? Place a towel between your skin and the bag. ? Leave the ice on for 20 minutes, 2-3 times a day.  Move your toes often to reduce stiffness and swelling.  Raise (elevate) the injured area above the level of your heart while you are sitting or lying down. Activity  Do not use the injured limb to support your body weight until your health care provider says that you can. Use crutches as told by your health care provider.  Return to your normal activities as told by your health care provider. Ask your health care provider what activities are safe for you.  Do exercises as told by your health care provider. General instructions  Take over-the-counter and prescription medicines only as told by your health care provider.  Do not use any products that contain nicotine or tobacco, such as cigarettes, e-cigarettes, and chewing tobacco. These can delay healing. If you need help quitting, ask your  health care provider.  Keep all follow-up visits as told by your health care provider. This is important. How is this prevented?  Warm up and stretch before being active.  Cool down and stretch after being active.  Give your body time to rest between periods of activity.  Make sure to use equipment that fits you.  Be safe and responsible while being active. This will help you avoid falls.  Do at least 150 minutes of moderate-intensity exercise each week, such as brisk walking or water aerobics.  Maintain physical fitness, including: ? Strength. ? Flexibility. ? Cardiovascular fitness. ? Endurance. Contact a health care provider if:  The pain in your knee gets worse and is not relieved by medicine.  Your knee catches or locks. Get help right away if:  Your patella slips out of its normal position again.  The swelling in your knee gets worse. Summary  Patellar dislocation and patellar subluxation are injuries that happen when the patella slips out of its normal position.  If your patella does not move back into place on its own, your health care provider will move it back into place.  Return to your normal activities as told by your health care provider. Ask your health care provider what activities are safe for you.  Do not use the injured limb to support your body weight until your health care provider says that you can. Use crutches as told by your health care provider.  Keep all follow-up visits as told by your health care provider. This is important. This information is not intended to replace advice given to you by your health care provider. Make sure you discuss any questions you have with your health care provider. Document Released: 10/09/2005 Document Revised: 02/04/2019 Document Reviewed: 09/02/2018 Elsevier Patient Education  2020 [employer].

## 2019-08-06 ENCOUNTER — Other Ambulatory Visit: Payer: Self-pay

## 2019-08-06 DIAGNOSIS — Z20822 Contact with and (suspected) exposure to covid-19: Secondary | ICD-10-CM

## 2019-08-07 LAB — NOVEL CORONAVIRUS, NAA: SARS-CoV-2, NAA: NOT DETECTED

## 2019-08-11 ENCOUNTER — Telehealth: Payer: Self-pay | Admitting: Physician Assistant

## 2019-08-11 NOTE — Telephone Encounter (Signed)
° °  Neg COVID reults given to pt

## 2020-08-04 ENCOUNTER — Other Ambulatory Visit: Payer: Self-pay | Admitting: Family Medicine

## 2020-08-04 DIAGNOSIS — L723 Sebaceous cyst: Secondary | ICD-10-CM

## 2020-08-27 ENCOUNTER — Other Ambulatory Visit: Payer: Self-pay | Admitting: Family Medicine

## 2020-08-27 DIAGNOSIS — L723 Sebaceous cyst: Secondary | ICD-10-CM

## 2020-09-21 ENCOUNTER — Ambulatory Visit (INDEPENDENT_AMBULATORY_CARE_PROVIDER_SITE_OTHER): Payer: PRIVATE HEALTH INSURANCE | Admitting: General Surgery

## 2020-09-21 ENCOUNTER — Other Ambulatory Visit: Payer: Self-pay

## 2020-09-21 ENCOUNTER — Encounter: Payer: Self-pay | Admitting: General Surgery

## 2020-09-21 VITALS — BP 150/93 | HR 82 | Temp 98.1°F | Resp 16 | Ht 64.0 in | Wt 362.0 lb

## 2020-09-21 DIAGNOSIS — L723 Sebaceous cyst: Secondary | ICD-10-CM | POA: Insufficient documentation

## 2020-09-21 MED ORDER — DOXYCYCLINE HYCLATE 50 MG PO CAPS: 50.0000 mg | ORAL_CAPSULE | Freq: Two times a day (BID) | ORAL | 0 refills | Status: AC

## 2020-09-21 NOTE — Patient Instructions (Addendum)
Take doxycycline if the area started to swell or feel infected before we get it out and notify the office.   Epidermal Cyst  An epidermal cyst is a sac made of skin tissue. The sac contains a substance called keratin. Keratin is a protein that is normally secreted through the hair follicles. When keratin becomes trapped in the top layer of skin (epidermis), it can form an epidermal cyst. Epidermal cysts can be found anywhere on your body. These cysts are usually harmless (benign), and they may not cause symptoms unless they become infected. What are the causes? This condition may be caused by:  A blocked hair follicle.  A hair that curls and re-enters the skin instead of growing straight out of the skin (ingrown hair).  A blocked pore.  Irritated skin.  An injury to the skin.  Certain conditions that are passed along from parent to child (inherited).  Human papillomavirus (HPV).  Long-term (chronic) sun damage to the skin. What increases the risk? The following factors may make you more likely to develop an epidermal cyst:  Having acne.  Being overweight.  Being 30-51 years old. What are the signs or symptoms? The only symptom of this condition may be a small, painless lump underneath the skin. When an epidermal cyst ruptures, it may become infected. Symptoms may include:  Redness.  Inflammation.  Tenderness.  Warmth.  Fever.  Keratin draining from the cyst. Keratin is grayish-white, bad-smelling substance.  Pus draining from the cyst. How is this diagnosed? This condition is diagnosed with a physical exam.  In some cases, you may have a sample of tissue (biopsy) taken from your cyst to be examined under a microscope or tested for bacteria.  You may be referred to a health care provider who specializes in skin care (dermatologist). How is this treated? In many cases, epidermal cysts go away on their own without treatment. If a cyst becomes infected, treatment  may include:  Opening and draining the cyst, done by a health care provider. After draining, minor surgery to remove the rest of the cyst may be done.  Antibiotic medicine.  Injections of medicines (steroids) that help to reduce inflammation.  Surgery to remove the cyst. Surgery may be done if the cyst: ? Becomes large. ? Bothers you. ? Has a chance of turning into cancer.  Do not try to open a cyst yourself. Follow these instructions at home:  Take over-the-counter and prescription medicines only as told by your health care provider.  If you were prescribed an antibiotic medicine, take it it as told by your health care provider. Do not stop using the antibiotic even if you start to feel better.  Keep the area around your cyst clean and dry.  Wear loose, dry clothing.  Avoid touching your cyst.  Check your cyst every day for signs of infection. Check for: ? Redness, swelling, or pain. ? Fluid or blood. ? Warmth. ? Pus or a bad smell.  Keep all follow-up visits as told by your health care provider. This is important. How is this prevented?  Wear clean, dry, clothing.  Avoid wearing tight clothing.  Keep your skin clean and dry. Take showers or baths every day. Contact a health care provider if:  Your cyst develops symptoms of infection.  Your condition is not improving or is getting worse.  You develop a cyst that looks different from other cysts you have had.  You have a fever. Get help right away if:  Redness spreads  from the cyst into the surrounding area. Summary  An epidermal cyst is a sac made of skin tissue. These cysts are usually harmless (benign), and they may not cause symptoms unless they become infected.  If a cyst becomes infected, treatment may include surgery to open and drain the cyst, or to remove it. Treatment may also include medicines by mouth or through an injection.  Take over-the-counter and prescription medicines only as told by your  health care provider. If you were prescribed an antibiotic medicine, take it as told by your health care provider. Do not stop using the antibiotic even if you start to feel better.  Contact a health care provider if your condition is not improving or is getting worse.  Keep all follow-up visits as told by your health care provider. This is important. This information is not intended to replace advice given to you by your health care provider. Make sure you discuss any questions you have with your health care provider. Document Revised: 01/30/2019 Document Reviewed: 04/22/2018 Elsevier Patient Education  2020 [employer].

## 2020-09-21 NOTE — Progress Notes (Signed)
[facility] History and Physical  Reason for Referral:  Sebaceous cyst on back  Referring Physician:  Legrand Pitts, PA   Chief Complaint    New Patient (Initial Visit)      Heather Moyer is a 39 y.o. female.  HPI:  Heather Moyer is a very sweet 39 yo who has a history of HTN, obesity and has had a cyst on her back that has drained periodically for about a year. She thinks she had a spider bite in the area and then noticed this issues. She says it gets inflamed and swollen and starts to drain. She has required antibiotics for them in the past. She says that the last time it drained and was infected was 2 months ago.  She has been on antibiotics three times. She had a cyst removed from her breast bone area in the past per her report.    Past Medical History:  Diagnosis Date  . Abnormal Pap smear   . Anemia   . Ankle fracture, left   . Blood transfusion, without reported diagnosis   . Depression 08/06/2013  . DUB (dysfunctional uterine bleeding) 06/04/2013  . Gestational diabetes   . Hypertension   . Irregular heart rate   . Measles without mention of complication    Measles  . Measles without mention of complication    Measles  . Nexplanon insertion 03/19/2013   Inserted 03/19/13 left arm  . Rubella without mention of complication    Rubella    Past Surgical History:  Procedure Laterality Date  . CESAREAN SECTION    . CHOLECYSTECTOMY    . cyst removed      Family History  Problem Relation Age of Onset  . Hypertension Mother   . Hypertension Father   . Heart disease Father        cardiac arrest  . Cancer Paternal Aunt        stomach  . Cancer Paternal Grandfather        throat  . Cancer Other     Social History   Tobacco Use  . Smoking status: Former Games developer  . Smokeless tobacco: Never Used  Substance Use Topics  . Alcohol use: No  . Drug use: No    Medications: I have reviewed the patient's current medications. Allergies as of 09/21/2020        Reactions   Feraheme [ferumoxytol] Anaphylaxis   Penicillins Swelling, Other (See Comments)   REACTION: genital swelling      Medication List       Accurate as of September 21, 2020 11:59 PM. If you have any questions, ask your nurse or doctor.        STOP taking these medications   amitriptyline 25 MG tablet Commonly known as: ELAVIL Stopped by: Lucretia Roers, MD   atenolol 25 MG tablet Commonly known as: TENORMIN Stopped by: Lucretia Roers, MD   cyclobenzaprine 10 MG tablet Commonly known as: FLEXERIL Stopped by: Lucretia Roers, MD   digoxin 0.125 MG tablet Commonly known as: LANOXIN Stopped by: Lucretia Roers, MD   ibuprofen 200 MG tablet Commonly known as: ADVIL Stopped by: Lucretia Roers, MD   levonorgestrel 20 MCG/24HR IUD Commonly known as: MIRENA Stopped by: Lucretia Roers, MD   meloxicam 7.5 MG tablet Commonly known as: Mobic Stopped by: Lucretia Roers, MD   Vitamin D (Ergocalciferol) 1.25 MG (50000 UNIT) Caps capsule Commonly known as: DRISDOL Stopped by: Lucretia Roers, MD  TAKE these medications   atorvastatin 20 MG tablet Commonly known as: LIPITOR Take 20 mg by mouth daily.   doxycycline 50 MG capsule Commonly known as: VIBRAMYCIN Take 1 capsule (50 mg total) by mouth 2 (two) times daily for 7 days. Started by: Lucretia Roers, MD   escitalopram 10 MG tablet Commonly known as: LEXAPRO escitalopram 10 mg tablet   hydrochlorothiazide 12.5 MG capsule Commonly known as: MICROZIDE hydrochlorothiazide 12.5 mg capsule  TAKE 1 CAPSULE BY MOUTH EVERY DAY   hydrOXYzine 25 MG tablet Commonly known as: ATARAX/VISTARIL hydroxyzine HCl 25 mg tablet        ROS:  A comprehensive review of systems was negative except for: Musculoskeletal: positive for joint pain, cyst on back  Blood pressure (!) 150/93, pulse 82, temperature 98.1 F (36.7 C), temperature source Other (Comment), resp. rate 16, height 5\' 4"   (1.626 m), weight (!) 362 lb (164.2 kg), SpO2 98 %. Physical Exam Vitals reviewed.  Constitutional:      Appearance: She is obese.  HENT:     Head: Normocephalic.     Nose: Nose normal.  Eyes:     Extraocular Movements: Extraocular movements intact.  Cardiovascular:     Rate and Rhythm: Normal rate and regular rhythm.  Pulmonary:     Effort: Pulmonary effort is normal.     Breath sounds: Normal breath sounds.  Abdominal:     General: There is no distension.     Palpations: Abdomen is soft.     Tenderness: There is no abdominal tenderness.  Musculoskeletal:        General: No swelling.     Cervical back: No rigidity.     Comments: Back with crevice between fatty tissue surrounding midline, right of midline granulation tissue on 1-2cm superficial lesion, no drainage or erythema  Skin:    General: Skin is warm.  Neurological:     General: No focal deficit present.     Mental Status: She is oriented to person, place, and time.  Psychiatric:        Mood and Affect: Mood normal.        Behavior: Behavior normal.        Thought Content: Thought content normal.        Judgment: Judgment normal.     Results: None    Assessment & Plan:  Heather Moyer is a 39 y.o. female with what is likely a sebaceous cyst or abscess cavity on her back in a difficult location in the crevice between her fatty tissue of her back. There is granulation tissue over the area from prior drainage and rupture.   -Discussed excision with local and risk of bleeding, infection, recurrence, need for packing if infected   Future Appointments  Date Time Provider Department Center  10/05/2020 11:30 AM 10/07/2020, MD [facility] None    All questions were answered to the satisfaction of the patient.   Lucretia Roers 09/24/2020, 1:45 PM

## 2020-10-05 ENCOUNTER — Ambulatory Visit (INDEPENDENT_AMBULATORY_CARE_PROVIDER_SITE_OTHER): Payer: PRIVATE HEALTH INSURANCE | Admitting: General Surgery

## 2020-10-05 ENCOUNTER — Other Ambulatory Visit: Payer: Self-pay | Admitting: General Surgery

## 2020-10-05 ENCOUNTER — Encounter: Payer: Self-pay | Admitting: General Surgery

## 2020-10-05 ENCOUNTER — Other Ambulatory Visit: Payer: Self-pay

## 2020-10-05 VITALS — BP 138/84 | HR 87 | Temp 98.0°F | Resp 18 | Ht 64.0 in | Wt 366.0 lb

## 2020-10-05 DIAGNOSIS — L723 Sebaceous cyst: Secondary | ICD-10-CM | POA: Diagnosis not present

## 2020-10-05 MED ORDER — OXYCODONE HCL 5 MG PO TABS: 5.0000 mg | ORAL_TABLET | ORAL | 0 refills | Status: DC | PRN

## 2020-10-05 NOTE — Patient Instructions (Signed)
Pack with saline dampened gauze daily to twice daily. Cover with dry gauze and tape. Ok to shower just change to new dressing after shower. Tylenol and ibuprofen for pain. Roxicodone for breakthrough pain.

## 2020-10-06 NOTE — Progress Notes (Signed)
[facility] Procedure Note  10/05/2020    Preoperative Diagnosis:  Sebaceous cyst back (right of midline)    Postoperative Diagnosis: Same   Procedure(s) Performed:  Excision of sebaceous cyst 2.5 cm   Surgeon: Leatrice Jewels. Henreitta Leber, MD   Assistants: No qualified resident was available    Anesthesia: 1% xylocaine with epinephrine    Specimens: Cyst    Estimated Blood Loss: Minimal   Blood Replacement: None    Complications: None   Wound Class: Contaminated    Operative Indications:  Heather Moyer is a 39 yo with recurrent drainage and infections from a cyst on her right mid back just right of midline. She has to be on antibiotics for this in the past. It continues to drain and cause her problems. She wants it excised. Discussed risk of bleeding, infection, recurrence, need for packing.   Findings: Sebaceous cyst with granulation tissue on the surface, woody inflammation, back with crevice type fat pad on either side of spine    Procedure: The patient was taken to the procedure room and sat upright hugging a pillow to open up her mid back.  The back was prepared with betadine and in the usual sterile fashion.  Lidocaine was injected around the area. An elliptical excision of the granulation tissue at the surface of the cyst was performed and the excision was carried down into the subcutaneous tissue with sharp dissection with scissors.There was significant woody inflammation around the cyst. The cavity was over 2.5 cm in size. The cavity was irritated with the scalpel to ensure that the entire wall was removed. The cavity was made hemostatic and packed with saline dampened gauze and covered with gauze and paper tape. Given the size and the location in the crevice, I do not think a closure would heal do to moisture and friction.    Final inspection revealed acceptable hemostasis. All counts were correct at the end of the case. The patient tolerated the procedure well.    Algis Greenhouse, MD [facility] [address]ella R[address] [place], [place] [address] [phone] (office)

## 2020-10-19 ENCOUNTER — Ambulatory Visit: Payer: PRIVATE HEALTH INSURANCE | Admitting: General Surgery

## 2021-07-05 ENCOUNTER — Other Ambulatory Visit: Payer: Self-pay

## 2021-07-05 ENCOUNTER — Ambulatory Visit (INDEPENDENT_AMBULATORY_CARE_PROVIDER_SITE_OTHER): Payer: 59 | Admitting: Adult Health

## 2021-07-05 ENCOUNTER — Other Ambulatory Visit (HOSPITAL_COMMUNITY)
Admission: RE | Admit: 2021-07-05 | Discharge: 2021-07-05 | Disposition: A | Discharge status: Home / Self Care | Payer: 59 | Source: Ambulatory Visit | Attending: Adult Health | Admitting: Adult Health

## 2021-07-05 ENCOUNTER — Encounter: Payer: Self-pay | Admitting: Obstetrics & Gynecology

## 2021-07-05 ENCOUNTER — Encounter: Payer: Self-pay | Admitting: Adult Health

## 2021-07-05 VITALS — BP 156/91 | HR 83 | Ht 64.0 in | Wt 361.0 lb

## 2021-07-05 DIAGNOSIS — N898 Other specified noninflammatory disorders of vagina: Secondary | ICD-10-CM | POA: Insufficient documentation

## 2021-07-05 DIAGNOSIS — Z3046 Encounter for surveillance of implantable subdermal contraceptive: Secondary | ICD-10-CM

## 2021-07-05 DIAGNOSIS — R232 Flushing: Secondary | ICD-10-CM

## 2021-07-05 DIAGNOSIS — N926 Irregular menstruation, unspecified: Secondary | ICD-10-CM

## 2021-07-05 DIAGNOSIS — Z124 Encounter for screening for malignant neoplasm of cervix: Secondary | ICD-10-CM | POA: Diagnosis not present

## 2021-07-05 DIAGNOSIS — R6882 Decreased libido: Secondary | ICD-10-CM

## 2021-07-05 NOTE — Progress Notes (Signed)
  Subjective:     Patient ID: Heather Moyer, female   DOB: 04/23/1981, 40 y.o.   MRN: [id number]  HPI Heather Moyer is a 40 year old black female,single, G2 P2 in for nexplanon removal. ANd she complains of vaginal dryness and pain at times,no sex in 5 years, no libido. She hs also having hot flashes and night sweats. She has gained a 100 lbs. No pap in about 8 years. PCP is Dr Sherrin Daisy.  Review of Systems For nexplanon removal +vaginal dryness +vagina pain at times No libido Irregular periods,has not had one in a year Last sex 5 years ago Reviewed past medical,surgical, social and family history. Reviewed medications and allergies.     Objective:   Physical Exam BP (!) 156/91 (BP Location: Left Arm, Patient Position: Sitting, Cuff Size: Large)   Pulse 83   Ht 5\' 4"  (1.626 m)   Wt (!) 361 lb (163.7 kg)   BMI 61.97 kg/m  Consent signed and time out called.   Left arm cleansed with betadine, and injected with 1.5 cc 2% lidocaine and waited til numb.Under sterile technique a #11 blade was used to make small vertical incision, and a curved forceps was used to easily remove rod. Steri strips applied. Pressure dressing applied.  Skin warm and dry.Pelvic: external genitalia is normal in appearance no lesions, vagina: pale with loss of moisture,urethra has no lesions or masses noted, cervix:smooth, pap with HR HPV genotyping performed,uterus: normal size, shape and contour, mildly  tender, no masses felt, adnexa: no masses or tenderness noted. Bladder is non tender and no masses felt. Difficult exam due to abdominal girth. Fall risk is low  Upstream - 07/05/21 1548       Pregnancy Intention Screening   Does the patient want to become pregnant in the next year? No    Does the patient's partner want to become pregnant in the next year? No    Would the patient like to discuss contraceptive options today? No      Contraception Wrap Up   Current Method Abstinence    End Method Abstinence     Contraception Counseling Provided No            Examination chaperoned by 07/07/21 LPN Assessment:    1. Routine cervical smear Pap sent  2. Encounter for Nexplanon removal Use condoms if has sex, keep clean and dry x 24 hours, no heavy lifting, keep steri strips on x 72 hours, Keep pressure dressing on x 24 hours. Follow up prn problems.   3. Vaginal dryness  4. Hot flashes  5. Irregular periods   6. Decreased libido     Plan:    Follow up in 4 weeks will check labs then to rule out menopause, and do breast exam, she needs a mammogram

## 2021-07-05 NOTE — Patient Instructions (Addendum)
Use condoms if has sex, keep clean and dry x 24 hours, no heavy lifting, keep steri strips on x 72 hours, Keep pressure dressing on x 24 hours. Follow up prn problems. 

## 2021-07-11 LAB — CYTOLOGY - PAP
Adequacy: ABSENT
Comment: NEGATIVE
Diagnosis: NEGATIVE
High risk HPV: NEGATIVE

## 2021-08-02 ENCOUNTER — Encounter: Payer: Self-pay | Admitting: Adult Health

## 2021-08-02 ENCOUNTER — Other Ambulatory Visit: Payer: Self-pay

## 2021-08-02 ENCOUNTER — Ambulatory Visit (INDEPENDENT_AMBULATORY_CARE_PROVIDER_SITE_OTHER): Payer: 59 | Admitting: Adult Health

## 2021-08-02 VITALS — BP 139/70 | HR 72 | Ht 64.0 in | Wt 360.6 lb

## 2021-08-02 DIAGNOSIS — N926 Irregular menstruation, unspecified: Secondary | ICD-10-CM

## 2021-08-02 DIAGNOSIS — Z1231 Encounter for screening mammogram for malignant neoplasm of breast: Secondary | ICD-10-CM | POA: Insufficient documentation

## 2021-08-02 DIAGNOSIS — R635 Abnormal weight gain: Secondary | ICD-10-CM

## 2021-08-02 DIAGNOSIS — R232 Flushing: Secondary | ICD-10-CM

## 2021-08-02 DIAGNOSIS — N898 Other specified noninflammatory disorders of vagina: Secondary | ICD-10-CM

## 2021-08-02 DIAGNOSIS — Z0000 Encounter for general adult medical examination without abnormal findings: Secondary | ICD-10-CM | POA: Diagnosis not present

## 2021-08-02 DIAGNOSIS — R6882 Decreased libido: Secondary | ICD-10-CM

## 2021-08-02 NOTE — Progress Notes (Signed)
  Subjective:     Patient ID: Heather Moyer, female   DOB: 08-21-1981, 40 y.o.   MRN: [id number]  HPI Heather Moyer is a 40 year old black female,single, G2P2 in for breast exam and schedule mammogram. She having hot flashes and irregular periods since having nexplanon removed, and has vaginal dryness, not as bad and deceased libido. And she had gained about 100 lbs with nexplanon. PCP is Dr Sherrin Daisy. Lab Results  Component Value Date   DIAGPAP  07/05/2021    - Negative for intraepithelial lesion or malignancy (NILM)   HPVHIGH Negative 07/05/2021    Review of Systems +hot flashes +irregular periods +vaginal dryness +decreased libido +weight gain  Reviewed past medical,surgical, social and family history. Reviewed medications and allergies.     Objective:   Physical Exam BP 139/70 (BP Location: Left Arm, Patient Position: Sitting, Cuff Size: Large)   Pulse 72   Ht 5\' 4"  (1.626 m)   Wt (!) 360 lb 9.6 oz (163.6 kg)   LMP  (LMP Unknown) Comment: No bleeding since Nexplanon removal  BMI 61.90 kg/m      Skin warm and dry,  Breasts:no dominate palpable mass, retraction or nipple discharge  Fall risk is low  Upstream - 08/02/21 0937       Pregnancy Intention Screening   Does the patient want to become pregnant in the next year? No    Does the patient's partner want to become pregnant in the next year? No    Would the patient like to discuss contraceptive options today? No      Contraception Wrap Up   Current Method Abstinence    End Method Abstinence    Contraception Counseling Provided No             Assessment:     1. Normal breast exam   2. Screening mammogram for breast cancer Mammogram scheduled for her 08/04/21 at 12:30 pm at [facility] - MM 3D SCREEN BREAST BILATERAL; Future  3. Hot flashes Will check labs today, CMP,TSH and FSH - Follicle stimulating hormone  4. Irregular periods  - Follicle stimulating hormone - TSH  5. Decreased libido   6. Vaginal  dryness   7. Weight gain  - Comprehensive metabolic panel - TSH     Plan:     Will talk when labs back Follow up prn

## 2021-08-04 ENCOUNTER — Ambulatory Visit (HOSPITAL_COMMUNITY): Payer: 59

## 2021-09-19 DIAGNOSIS — Z20822 Contact with and (suspected) exposure to covid-19: Secondary | ICD-10-CM | POA: Diagnosis not present

## 2021-09-19 DIAGNOSIS — J069 Acute upper respiratory infection, unspecified: Secondary | ICD-10-CM | POA: Diagnosis not present

## 2022-04-17 ENCOUNTER — Other Ambulatory Visit (HOSPITAL_COMMUNITY)
Admission: RE | Admit: 2022-04-17 | Discharge: 2022-04-17 | Disposition: A | Discharge status: Home / Self Care | Payer: BC Managed Care – PPO | Source: Ambulatory Visit | Attending: Adult Health | Admitting: Adult Health

## 2022-04-17 ENCOUNTER — Ambulatory Visit (INDEPENDENT_AMBULATORY_CARE_PROVIDER_SITE_OTHER): Payer: PRIVATE HEALTH INSURANCE | Admitting: Adult Health

## 2022-04-17 ENCOUNTER — Encounter: Payer: Self-pay | Admitting: Adult Health

## 2022-04-17 VITALS — BP 150/93 | HR 74 | Ht 64.0 in | Wt 322.0 lb

## 2022-04-17 DIAGNOSIS — N760 Acute vaginitis: Secondary | ICD-10-CM | POA: Diagnosis not present

## 2022-04-17 DIAGNOSIS — N926 Irregular menstruation, unspecified: Secondary | ICD-10-CM | POA: Diagnosis not present

## 2022-04-17 DIAGNOSIS — N898 Other specified noninflammatory disorders of vagina: Secondary | ICD-10-CM | POA: Insufficient documentation

## 2022-04-17 DIAGNOSIS — N941 Unspecified dyspareunia: Secondary | ICD-10-CM | POA: Insufficient documentation

## 2022-04-17 DIAGNOSIS — Z3202 Encounter for pregnancy test, result negative: Secondary | ICD-10-CM

## 2022-04-17 LAB — POCT URINE PREGNANCY: Preg Test, Ur: NEGATIVE

## 2022-04-18 ENCOUNTER — Telehealth: Payer: Self-pay | Admitting: *Deleted

## 2022-04-18 ENCOUNTER — Other Ambulatory Visit: Payer: Self-pay | Admitting: Adult Health

## 2022-04-18 LAB — CERVICOVAGINAL ANCILLARY ONLY
Bacterial Vaginitis (gardnerella): POSITIVE — AB
Candida Glabrata: NEGATIVE
Candida Vaginitis: NEGATIVE
Chlamydia: NEGATIVE
Comment: NEGATIVE
Comment: NEGATIVE
Comment: NEGATIVE
Comment: NEGATIVE
Comment: NEGATIVE
Comment: NORMAL
Neisseria Gonorrhea: NEGATIVE
Trichomonas: NEGATIVE

## 2022-04-18 MED ORDER — METRONIDAZOLE 500 MG PO TABS: 500.0000 mg | ORAL_TABLET | Freq: Two times a day (BID) | ORAL | 0 refills | Status: DC

## 2022-04-18 NOTE — Progress Notes (Signed)
+  BV on vaginal swab will rx flagyl no sex or alcohol while taking

## 2022-05-03 ENCOUNTER — Ambulatory Visit: Payer: BC Managed Care – PPO | Admitting: Adult Health

## 2022-05-03 ENCOUNTER — Encounter: Payer: Self-pay | Admitting: Adult Health

## 2022-05-03 VITALS — BP 146/71 | HR 80 | Ht 64.0 in | Wt 322.4 lb

## 2022-05-03 DIAGNOSIS — Z30011 Encounter for initial prescription of contraceptive pills: Secondary | ICD-10-CM | POA: Insufficient documentation

## 2022-05-03 DIAGNOSIS — Z3202 Encounter for pregnancy test, result negative: Secondary | ICD-10-CM

## 2022-05-03 LAB — POCT URINE PREGNANCY: Preg Test, Ur: NEGATIVE

## 2022-05-03 MED ORDER — NORETHINDRONE 0.35 MG PO TABS: 1.0000 | ORAL_TABLET | Freq: Every day | ORAL | 11 refills | Status: DC

## 2022-05-03 NOTE — Progress Notes (Signed)
  Subjective:     Patient ID: Heather Moyer, female   DOB: 05/11/1981, 41 y.o.   MRN: [id number]  HPI Estrella is a 41 year old black female,single, G2P2 in requesting birth control she wants the pill.  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  Lab Results  Component Value Date   DIAGPAP  07/05/2021    - Negative for intraepithelial lesion or malignancy (NILM)   HPVHIGH Negative 07/05/2021   PCP is Legrand Pitts PA.  Review of Systems Patient denies any headaches, hearing loss, fatigue, blurred vision, shortness of breath, chest pain, abdominal pain, problems with bowel movements, urination, or intercourse. No joint pain or mood swings.    Reviewed past medical,surgical, social and family history. Reviewed medications and allergies.  Objective:   Physical Exam BP (!) 146/71 (BP Location: Left Arm, Patient Position: Sitting, Cuff Size: Large)   Pulse 80   Ht 5\' 4"  (1.626 m)   Wt (!) 322 lb 6.4 oz (146.2 kg)   LMP 04/25/2022   BMI 55.34 kg/m  UPT is negative    Skin warm and dry.  Lungs: clear to ausculation bilaterally. Cardiovascular: regular rate and rhythm.  Fall risk is low  Upstream - 05/03/22 0931       Pregnancy Intention Screening   Does the patient want to become pregnant in the next year? No    Does the patient's partner want to become pregnant in the next year? No    Would the patient like to discuss contraceptive options today? Yes      Contraception Wrap Up   Current Method Spermicide (used alone)    End Method Oral Contraceptive    Contraception Counseling Provided Yes             Assessment:     1. Pregnancy examination or test, negative result  2. Encounter for initial prescription of contraceptive pills She wants a pills She says she had mild strike at 23 Will start on POP, rx sent for Micronor, can start today and use condoms or spermicide for 1 pack     Plan:     Follow up 07/10/22 for physical, BP check and ROS

## 2022-07-10 ENCOUNTER — Encounter: Payer: Self-pay | Admitting: Adult Health

## 2022-07-10 ENCOUNTER — Ambulatory Visit (INDEPENDENT_AMBULATORY_CARE_PROVIDER_SITE_OTHER): Payer: BC Managed Care – PPO | Admitting: Adult Health

## 2022-07-10 VITALS — BP 134/77 | HR 78 | Ht 64.0 in | Wt 327.0 lb

## 2022-07-10 DIAGNOSIS — Z818 Family history of other mental and behavioral disorders: Secondary | ICD-10-CM | POA: Insufficient documentation

## 2022-07-10 DIAGNOSIS — Z01419 Encounter for gynecological examination (general) (routine) without abnormal findings: Secondary | ICD-10-CM | POA: Insufficient documentation

## 2022-07-10 DIAGNOSIS — Z3041 Encounter for surveillance of contraceptive pills: Secondary | ICD-10-CM

## 2022-07-10 DIAGNOSIS — Z1211 Encounter for screening for malignant neoplasm of colon: Secondary | ICD-10-CM

## 2022-07-10 LAB — HEMOCCULT GUIAC POC 1CARD (OFFICE): Fecal Occult Blood, POC: NEGATIVE

## 2022-07-10 MED ORDER — NORETHINDRONE 0.35 MG PO TABS: 1.0000 | ORAL_TABLET | Freq: Every day | ORAL | 4 refills | Status: DC

## 2022-07-10 NOTE — Progress Notes (Signed)
Patient ID: Heather Moyer, female   DOB: [birth year], 41 y.o.   MRN: [id number] History of Present Illness: Heather Moyer is a 41 year old black female,single, G2P2 in for a well woman gyn exam.  Last pap was 07/05/21 negative for malignancy and HPV.  PPC is Heather Moyer.  Current Medications, Allergies, Past Medical History, Past Surgical History, Family History and Social History were reviewed in [employer] record.     Review of Systems: Patient denies any headaches, hearing loss, fatigue, blurred vision, shortness of breath, abdominal pain, problems with bowel movements, urination, or intercourse(has dryness,to use lubricate).. No joint pain or mood swings.  Has chest pain at times, when gets home and gets stressed, with brother, she is moving soon and hopes stress is better. 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 Periods are good on Micronor.   Physical Exam:BP 134/77 (BP Location: Right Arm, Patient Position: Sitting, Cuff Size: Large)   Pulse 78   Ht 5\' 4"  (1.626 m)   Wt (!) 327 lb (148.3 kg)   LMP 06/30/2022   BMI 56.13 kg/m   General:  Well developed, well nourished, no acute distress Skin:  Warm and dry Neck:  Midline trachea, normal thyroid, good ROM, no lymphadenopathy Lungs; Clear to auscultation bilaterally Breast:  No dominant palpable mass, retraction, or nipple discharge Cardiovascular: Regular rate and rhythm Abdomen:  Soft, non tender, no hepatosplenomegaly Pelvic:  External genitalia is normal in appearance, no lesions.  The vagina is normal in appearance. Urethra has no lesions or masses. The cervix is bulbous.  Uterus is felt to be normal size, shape, and contour.  No adnexal masses or tenderness noted.Bladder is non tender, no masses felt. Rectal: Good sphincter tone, no polyps, or hemorrhoids felt.  Hemoccult negative. Extremities/musculoskeletal:  No swelling or varicosities noted, no clubbing or cyanosis Psych:  No mood  changes, alert and cooperative,seems happy AA is 1 Fall risk is low    07/10/2022   10:11 AM 07/03/2014    8:45 AM 05/22/2014    1:38 PM  Depression screen PHQ 2/9  Decreased Interest 0 0 0  Down, Depressed, Hopeless 0 0 1  PHQ - 2 Score 0 0 1  Altered sleeping 0    Tired, decreased energy 0    Change in appetite 0    Feeling bad or failure about yourself  0    Trouble concentrating 0    Moving slowly or fidgety/restless 0    Suicidal thoughts 0    PHQ-9 Score 0         07/10/2022   10:12 AM  GAD 7 : Generalized Anxiety Score  Nervous, Anxious, on Edge 1  Control/stop worrying 0  Worry too much - different things 0  Trouble relaxing 0  Restless 0  Easily annoyed or irritable 1  Afraid - awful might happen 0  Total GAD 7 Score 2      Upstream - 07/10/22 1019       Pregnancy Intention Screening   Does the patient want to become pregnant in the next year? No    Does the patient's partner want to become pregnant in the next year? No    Would the patient like to discuss contraceptive options today? No      Contraception Wrap Up   Current Method Oral Contraceptive    End Method Oral Contraceptive  Examination chaperoned by Malachy Mood LPN  Impression and Plan: 1. Encounter for well woman exam with routine gynecological exam Physical in 1 year Pap 2025 Labs with PCP Get mammogram   2. Encounter for screening fecal occult blood testing Hemoccult was negative   3. Encounter for surveillance of contraceptive pills Periods good Will refill Micronor Meds ordered this encounter  Medications   norethindrone (MICRONOR) 0.35 MG tablet    Sig: Take 1 tablet (0.35 mg total) by mouth daily.    Dispense:  84 tablet    Refill:  4    Order Specific Question:   Supervising Provider    Answer:   Despina Hidden, LUTHER H [[id number]]     4. Family history of stress Has stress with brother living with her, but she is moving soon She declines meds If has new or different  chest pain go to ER, and she says she will

## 2022-12-07 DIAGNOSIS — R079 Chest pain, unspecified: Secondary | ICD-10-CM | POA: Diagnosis not present

## 2022-12-07 DIAGNOSIS — R0789 Other chest pain: Secondary | ICD-10-CM | POA: Diagnosis not present

## 2022-12-19 DIAGNOSIS — Z20822 Contact with and (suspected) exposure to covid-19: Secondary | ICD-10-CM | POA: Diagnosis not present

## 2022-12-19 DIAGNOSIS — J020 Streptococcal pharyngitis: Secondary | ICD-10-CM | POA: Diagnosis not present

## 2023-01-16 DIAGNOSIS — R059 Cough, unspecified: Secondary | ICD-10-CM | POA: Diagnosis not present

## 2023-01-16 DIAGNOSIS — J069 Acute upper respiratory infection, unspecified: Secondary | ICD-10-CM | POA: Diagnosis not present

## 2023-01-16 DIAGNOSIS — R0981 Nasal congestion: Secondary | ICD-10-CM | POA: Diagnosis not present

## 2023-09-09 ENCOUNTER — Other Ambulatory Visit: Payer: Self-pay | Admitting: Adult Health

## 2023-12-06 DIAGNOSIS — Z20822 Contact with and (suspected) exposure to covid-19: Secondary | ICD-10-CM | POA: Diagnosis not present

## 2023-12-06 DIAGNOSIS — J209 Acute bronchitis, unspecified: Secondary | ICD-10-CM | POA: Diagnosis not present

## 2024-10-22 DIAGNOSIS — M25562 Pain in left knee: Secondary | ICD-10-CM | POA: Diagnosis not present

## 2024-10-22 DIAGNOSIS — M1712 Unilateral primary osteoarthritis, left knee: Secondary | ICD-10-CM | POA: Diagnosis not present

## 2024-11-06 ENCOUNTER — Other Ambulatory Visit: Payer: Self-pay | Admitting: Adult Health

## 2024-12-03 ENCOUNTER — Ambulatory Visit: Admitting: Advanced Practice Midwife
